# Patient Record
Sex: Female | Born: 1980 | Race: White | Hispanic: No | Marital: Married | Smoking: Never smoker
Health system: Southern US, Community
[De-identification: ages and names within clinical notes are randomized; demographics above are authoritative.]

## PROBLEM LIST (undated history)

## (undated) DIAGNOSIS — D696 Thrombocytopenia, unspecified: Principal | ICD-10-CM

## (undated) HISTORY — PX: TONSILLECTOMY AND ADENOIDECTOMY: SUR1326

## (undated) HISTORY — DX: Thrombocytopenia, unspecified: D69.6

---

## 2012-07-06 ENCOUNTER — Other Ambulatory Visit (HOSPITAL_COMMUNITY): Payer: Self-pay | Admitting: Obstetrics and Gynecology

## 2012-07-06 DIAGNOSIS — N979 Female infertility, unspecified: Secondary | ICD-10-CM

## 2012-07-12 ENCOUNTER — Ambulatory Visit (HOSPITAL_COMMUNITY)
Admission: RE | Admit: 2012-07-12 | Discharge: 2012-07-12 | Disposition: A | Discharge status: Home / Self Care | Payer: 59 | Source: Ambulatory Visit | Attending: Obstetrics and Gynecology | Admitting: Obstetrics and Gynecology

## 2012-07-12 DIAGNOSIS — N979 Female infertility, unspecified: Secondary | ICD-10-CM | POA: Insufficient documentation

## 2012-07-12 MED ORDER — IOHEXOL 300 MG/ML  SOLN: 5.0000 mL | Freq: Once | INTRAMUSCULAR | Status: AC | PRN

## 2012-09-22 NOTE — L&D Delivery Note (Signed)
Delivery Note At 5:19 AM a viable female was delivered via Vaginal, Spontaneous Delivery (Presentation: ; Occiput Anterior).  APGAR: 9 9    Placenta status: Intact, Spontaneous.  Cord: 3 vessels with the following complications: tight nuchal.  Cord pH: not obtained  Anesthesia: Epidural  Episiotomy: none Lacerations: first Suture Repair: 3.0 chromic Est. Blood Loss (mL): 300  Mom to postpartum.  Baby to nursery-stable.  Jasmyne Lodato L 06/29/2013, 5:30 AM

## 2012-12-21 LAB — OB RESULTS CONSOLE RUBELLA ANTIBODY, IGM
Rubella: IMMUNE
Rubella: IMMUNE

## 2012-12-21 LAB — OB RESULTS CONSOLE ABO/RH
RH Type: POSITIVE
RH Type: POSITIVE

## 2012-12-21 LAB — OB RESULTS CONSOLE GC/CHLAMYDIA
Chlamydia: NEGATIVE
Chlamydia: NEGATIVE

## 2012-12-21 LAB — OB RESULTS CONSOLE HEPATITIS B SURFACE ANTIGEN
Hepatitis B Surface Ag: NEGATIVE
Hepatitis B Surface Ag: NEGATIVE

## 2012-12-21 LAB — OB RESULTS CONSOLE HIV ANTIBODY (ROUTINE TESTING): HIV: NONREACTIVE

## 2012-12-21 LAB — OB RESULTS CONSOLE RPR: RPR: NONREACTIVE

## 2012-12-29 ENCOUNTER — Telehealth: Payer: Self-pay | Admitting: Oncology

## 2012-12-29 NOTE — Telephone Encounter (Signed)
LVOM FOR PT TO RETURN CALL IN RE NP APPT.  °

## 2012-12-30 ENCOUNTER — Telehealth: Payer: Self-pay | Admitting: Oncology

## 2012-12-30 NOTE — Telephone Encounter (Signed)
C/D 12/30/12 for appt. 01/12/13

## 2012-12-30 NOTE — Telephone Encounter (Signed)
S/W PT IN RE NP APPT 04/23 @ 3 W/DR. HA REFERRIGN DR. Renaldo Fiddler DX- [redacted]WKS PREGNANT- THROMBOCYTOPENIA  WELCOME PACKET MAILED.

## 2013-01-10 NOTE — Patient Instructions (Signed)
1.  Issue:  Thrombocytopenia (low platelet count). 2.  Potential causes:  Pregnancy, autoimune response.  3.  Recommendation:    *  Low risk of spontaneous bleeding wth plalet > 30.  *  If and when platelet <30, we should consider Prednisone.

## 2013-01-12 ENCOUNTER — Other Ambulatory Visit (HOSPITAL_BASED_OUTPATIENT_CLINIC_OR_DEPARTMENT_OTHER): Payer: 59 | Admitting: Lab

## 2013-01-12 ENCOUNTER — Ambulatory Visit (HOSPITAL_BASED_OUTPATIENT_CLINIC_OR_DEPARTMENT_OTHER): Payer: 59 | Admitting: Oncology

## 2013-01-12 ENCOUNTER — Ambulatory Visit: Payer: 59

## 2013-01-12 ENCOUNTER — Encounter: Payer: Self-pay | Admitting: Oncology

## 2013-01-12 ENCOUNTER — Telehealth: Payer: Self-pay | Admitting: Oncology

## 2013-01-12 VITALS — BP 127/81 | HR 88 | Temp 98.7°F | Resp 18 | Ht 66.0 in | Wt 117.6 lb

## 2013-01-12 DIAGNOSIS — D696 Thrombocytopenia, unspecified: Secondary | ICD-10-CM

## 2013-01-12 LAB — CBC WITH DIFFERENTIAL/PLATELET
BASO%: 0.2 % (ref 0.0–2.0)
Basophils Absolute: 0 10*3/uL (ref 0.0–0.1)
EOS%: 1.9 % (ref 0.0–7.0)
HCT: 37.9 % (ref 34.8–46.6)
HGB: 13.6 g/dL (ref 11.6–15.9)
LYMPH%: 21.8 % (ref 14.0–49.7)
MCH: 32 pg (ref 25.1–34.0)
MCHC: 35.9 g/dL (ref 31.5–36.0)
MCV: 89.2 fL (ref 79.5–101.0)
NEUT%: 68.7 % (ref 38.4–76.8)
Platelets: 103 10*3/uL — ABNORMAL LOW (ref 145–400)
lymph#: 2.2 10*3/uL (ref 0.9–3.3)

## 2013-01-12 LAB — COMPREHENSIVE METABOLIC PANEL (CC13)
ALT: 8 U/L (ref 0–55)
Albumin: 3.7 g/dL (ref 3.5–5.0)
Alkaline Phosphatase: 46 U/L (ref 40–150)
CO2: 24 mEq/L (ref 22–29)
Glucose: 86 mg/dl (ref 70–99)
Potassium: 3.6 mEq/L (ref 3.5–5.1)
Sodium: 137 mEq/L (ref 136–145)
Total Bilirubin: 0.25 mg/dL (ref 0.20–1.20)
Total Protein: 7.2 g/dL (ref 6.4–8.3)

## 2013-01-12 LAB — MORPHOLOGY: PLT EST: DECREASED

## 2013-01-12 LAB — VITAMIN B12: Vitamin B-12: 779 pg/mL (ref 211–911)

## 2013-01-12 NOTE — Progress Notes (Signed)
Checked in new patient. No financial issues. The patient was a little nervous. I walked her over to Lab.

## 2013-01-12 NOTE — Progress Notes (Signed)
[facility]  [phone] Fax:([phone]     INITIAL HEMATOLOGY CONSULTATION    Referral MD:  Dr. Renaldo Fiddler.   Reason for Referral: thrombocytopenia; [redacted] week pregnant GP0.     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  She is not aware of her baseline CBC.  She is now 10 week-pregnant with her first child.  She established obstetric care with Dr. Renaldo Fiddler.  A routine CBC was obtained on 12/21/2012 which showed WBC 9.6; hgb 14.5; Plt 92K.  Other work up showed HIV negative; HBsAg negative; normal free T4.  She was kindly referred to the [facility] for evaluation.   Lindsey Ramirez presented to the clinic by herself today.  She reported feeling well.  She is nervous being here.  Otherwise, she denies fever, anorexia, weight loss, fatigue, headache, visual changes, confusion, drenching night sweats, palpable lymph node swelling, mucositis, odynophagia, dysphagia, nausea vomiting, jaundice, chest pain, palpitation, shortness of breath, dyspnea on exertion, productive cough, gum bleeding, epistaxis, hematemesis, hemoptysis, abdominal pain, abdominal swelling, early satiety, melena, hematochezia, hematuria, skin rash, spontaneous bleeding, joint swelling, joint pain, heat or cold intolerance, bowel bladder incontinence, back pain, focal motor weakness, paresthesia, depression.      History reviewed. No pertinent past medical history.:    Past Surgical History  Procedure Laterality Date  . Tonsillectomy and adenoidectomy    :   CURRENT MEDS: Current Outpatient Prescriptions  Medication Sig Dispense Refill  . Prenatal Vit-Fe Fumarate-FA (MULTIVITAMIN-PRENATAL) 27-0.8 MG TABS Take 1 tablet by mouth daily at 12 noon.       No current facility-administered medications for this visit.      Allergies no known allergies:  Family History  Problem Relation Age of Onset  . Cancer Maternal Aunt     breast 60's.    :  History   Social History  . Marital Status: Unknown    Spouse Name: N/A    Number of Children: 0  . Years of Education: N/A   Occupational History  .      teacher, kindergarden.    Social History Main Topics  . Smoking status: Never Smoker   . Smokeless tobacco: Never Used  . Alcohol Use: No  . Drug Use: No  . Sexually Active: Not on file   Other Topics Concern  . Not on file   Social History Narrative  . No narrative on file  :  REVIEW OF SYSTEM:  The rest of the 14-point review of sytem was negative.   Exam: ECOG 0.   General:  well-nourished woman, in no acute distress.  Eyes:  no scleral icterus.  ENT:  There were no oropharyngeal lesions.  Neck was without thyromegaly.  Lymphatics:  Negative cervical, supraclavicular or axillary adenopathy.  Respiratory: lungs were clear bilaterally without wheezing or crackles.  Cardiovascular:  Regular rate and rhythm, S1/S2, without murmur, rub or gallop.  There was no pedal edema.  There was no splenomegaly.  Muscoloskeletal:  no spinal tenderness of palpation of vertebral spine.  Skin exam was without echymosis, petichae.  Neuro exam was nonfocal.  Patient was able to get on and off exam table without assistance.  Gait was normal.  Patient was alert and oriented.  Attention was good.   Language was appropriate.  Mood was normal without depression.  Speech was not pressured.  Thought content was not tangential.    LABS:  Lab Results  Component Value  Date   WBC 10.3 01/12/2013   HGB 13.6 01/12/2013   HCT 37.9 01/12/2013   PLT 103 Platelet count consistent in citrate* 01/12/2013   GLUCOSE 86 01/12/2013   ALT 8 01/12/2013   AST 16 01/12/2013   NA 137 01/12/2013   K 3.6 01/12/2013   CL 103 01/12/2013   CREATININE 0.8 01/12/2013   BUN 11.9 01/12/2013   CO2 24 01/12/2013    Blood smear review:   I personally reviewed the patient's peripheral blood smear today.  There was isocytosis.  There was no peripheral blast.  There was no  schistocytosis, spherocytosis, target cell, rouleaux formation, tear drop cell.  There was no giant platelets or platelet clumps.      ASSESSMENT AND PLAN:   1.  [redacted] week pregnant G1P0.  2.  Thrombocytopenia:  Unknown duration since no baseline CBC is available.  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  Extensive work up was negative for HIV, hepatitis, hypothyroid, VitB12 deficiency.  She has no symptoms of autoimmune disease. Her platelet has been stable from 3 weeks ago.  Gestational thrombocytopenia is normally seen during 3rd trimester.  Blood smear evaluation showed no sign of microscopic angiopathic hemolysis.  There was low clinical concern for a primary bone marrow failure process due to normal WBC and Hgb.  - Status:  Mild, stable thrombocytopenia.  Risk of spontaneous hemorrhage occurs when plt <30.  - Recommendation:  Watchful observation with monthly CBC here at the [facility].  There is no active bleeding.  There is no need for transfusion or intervention.  As her due date approaches in November 2014, her platelet count may need to be around 100 despite lack of severe thrombocytopenia as to minimize risk of bleeding with epidural anesthesia or C/S.   Lindsey Ramirez expressed informed understanding and agreed with the above recommendation.   The length of time of the face-to-face encounter was 45 minutes. More than 50% of time was spent counseling and coordination of care.     Thank you for this referral.

## 2013-01-12 NOTE — Telephone Encounter (Signed)
gve the pt her may-July 2014 appt calendars

## 2013-02-11 ENCOUNTER — Other Ambulatory Visit (HOSPITAL_BASED_OUTPATIENT_CLINIC_OR_DEPARTMENT_OTHER): Payer: 59 | Admitting: Lab

## 2013-02-11 DIAGNOSIS — D696 Thrombocytopenia, unspecified: Secondary | ICD-10-CM

## 2013-02-11 LAB — CBC WITH DIFFERENTIAL/PLATELET
BASO%: 0.3 % (ref 0.0–2.0)
EOS%: 1.6 % (ref 0.0–7.0)
MCH: 32.9 pg (ref 25.1–34.0)
MCHC: 35.6 g/dL (ref 31.5–36.0)
RDW: 12.4 % (ref 11.2–14.5)
lymph#: 2 10*3/uL (ref 0.9–3.3)

## 2013-02-15 ENCOUNTER — Encounter: Payer: Self-pay | Admitting: Oncology

## 2013-02-22 ENCOUNTER — Telehealth: Payer: Self-pay | Admitting: *Deleted

## 2013-02-22 NOTE — Telephone Encounter (Signed)
Pt reports a one inch spot behind her forearm which appears reddish w/ tiny red dots.  Denies any other areas of redness or tiny dots on her arms or legs or anywhere else on her body.  Denies any unusual bleeding. Instructed pt to call for any diffuse redness or bleeding.  This is likely a bruise since it is only in one small area.  If she develops more bruising or spots, call us back,  Otherwise keep appt for lab as scheduled on 6/23.  She verbalized understanding.

## 2013-03-14 ENCOUNTER — Other Ambulatory Visit (HOSPITAL_BASED_OUTPATIENT_CLINIC_OR_DEPARTMENT_OTHER): Payer: 59 | Admitting: Lab

## 2013-03-14 DIAGNOSIS — D696 Thrombocytopenia, unspecified: Secondary | ICD-10-CM

## 2013-03-14 LAB — CBC WITH DIFFERENTIAL/PLATELET
Basophils Absolute: 0 10*3/uL (ref 0.0–0.1)
Eosinophils Absolute: 0.1 10*3/uL (ref 0.0–0.5)
HCT: 33.4 % — ABNORMAL LOW (ref 34.8–46.6)
HGB: 11.7 g/dL (ref 11.6–15.9)
LYMPH%: 13.7 % — ABNORMAL LOW (ref 14.0–49.7)
MCV: 92.9 fL (ref 79.5–101.0)
MONO#: 0.8 10*3/uL (ref 0.1–0.9)
MONO%: 6.4 % (ref 0.0–14.0)
NEUT#: 9.4 10*3/uL — ABNORMAL HIGH (ref 1.5–6.5)
NEUT%: 78.3 % — ABNORMAL HIGH (ref 38.4–76.8)
Platelets: 77 10*3/uL — ABNORMAL LOW (ref 145–400)
WBC: 11.9 10*3/uL — ABNORMAL HIGH (ref 3.9–10.3)

## 2013-03-14 LAB — COMPREHENSIVE METABOLIC PANEL (CC13)
ALT: 7 U/L (ref 0–55)
BUN: 11.2 mg/dL (ref 7.0–26.0)
CO2: 22 mEq/L (ref 22–29)
Calcium: 8.8 mg/dL (ref 8.4–10.4)
Chloride: 106 mEq/L (ref 98–107)
Creatinine: 0.7 mg/dL (ref 0.6–1.1)
Glucose: 78 mg/dl (ref 70–99)
Total Bilirubin: 0.36 mg/dL (ref 0.20–1.20)

## 2013-03-14 LAB — CHCC SMEAR

## 2013-04-02 ENCOUNTER — Other Ambulatory Visit: Payer: Self-pay | Admitting: Oncology

## 2013-04-02 DIAGNOSIS — D696 Thrombocytopenia, unspecified: Secondary | ICD-10-CM

## 2013-04-04 ENCOUNTER — Other Ambulatory Visit (HOSPITAL_BASED_OUTPATIENT_CLINIC_OR_DEPARTMENT_OTHER): Payer: 59 | Admitting: Lab

## 2013-04-04 ENCOUNTER — Telehealth: Payer: Self-pay | Admitting: Oncology

## 2013-04-04 ENCOUNTER — Ambulatory Visit (HOSPITAL_BASED_OUTPATIENT_CLINIC_OR_DEPARTMENT_OTHER): Payer: 59 | Admitting: Oncology

## 2013-04-04 VITALS — BP 118/41 | HR 73 | Temp 98.4°F | Resp 18 | Ht 66.0 in | Wt 122.9 lb

## 2013-04-04 DIAGNOSIS — D696 Thrombocytopenia, unspecified: Secondary | ICD-10-CM

## 2013-04-04 DIAGNOSIS — D693 Immune thrombocytopenic purpura: Secondary | ICD-10-CM

## 2013-04-04 LAB — CBC WITH DIFFERENTIAL/PLATELET
BASO%: 0.3 % (ref 0.0–2.0)
Eosinophils Absolute: 0.2 10*3/uL (ref 0.0–0.5)
HCT: 34.3 % — ABNORMAL LOW (ref 34.8–46.6)
MCHC: 34.8 g/dL (ref 31.5–36.0)
MONO#: 0.7 10*3/uL (ref 0.1–0.9)
NEUT#: 7.3 10*3/uL — ABNORMAL HIGH (ref 1.5–6.5)
NEUT%: 75.4 % (ref 38.4–76.8)
Platelets: 78 10*3/uL — ABNORMAL LOW (ref 145–400)
WBC: 9.7 10*3/uL (ref 3.9–10.3)
lymph#: 1.5 10*3/uL (ref 0.9–3.3)

## 2013-04-04 NOTE — Telephone Encounter (Signed)
gv and printed appt sched and avs forl pt. °

## 2013-04-05 NOTE — Progress Notes (Signed)
[facility]  Telephone:([phone] Fax:([phone]) [phone]   OFFICE PROGRESS NOTE   Cc:  Pcp Not In System  DIAGNOSIS: gestational thrombocytopenia.   CURRENT THERAPY:  Observation.   INTERVAL HISTORY: Lindsey Ramirez 32 y.o. female returns for regular follow up.  She is now week 25 of pregnancy.  She is due to deliver 07/22/2013. She is still working full time.  She denied fatigue, fever, visible source of bleeding, SOB, chest pain, abd pain, skin rash.  The rest of the 14-point review of system was negative.     Past Surgical History  Procedure Laterality Date  . Tonsillectomy and adenoidectomy      Current Outpatient Prescriptions  Medication Sig Dispense Refill  . Prenatal Vit-Fe Fumarate-FA (MULTIVITAMIN-PRENATAL) 27-0.8 MG TABS Take 1 tablet by mouth daily at 12 noon.       No current facility-administered medications for this visit.    ALLERGIES:  has No Known Allergies.  REVIEW OF SYSTEMS:  The rest of the 14-point review of system was negative.   Filed Vitals:   04/04/13 1202  BP: 118/41  Pulse: 73  Temp: 98.4 F (36.9 C)  Resp: 18   Wt Readings from Last 3 Encounters:  04/04/13 122 lb 14.4 oz (55.747 kg)  01/12/13 117 lb 9.6 oz (53.343 kg)   ECOG Performance status: 0  PHYSICAL EXAMINATION:   General:  well-nourished woman, in no acute distress.  Eyes:  no scleral icterus.  ENT:  There were no oropharyngeal lesions.  Neck was without thyromegaly.  Lymphatics:  Negative cervical, supraclavicular or axillary adenopathy.  Respiratory: lungs were clear bilaterally without wheezing or crackles.  Cardiovascular:  Regular rate and rhythm, S1/S2, without murmur, rub or gallop.  There was no pedal edema. Muscoloskeletal:  no spinal tenderness of palpation of vertebral spine.  Skin exam was without echymosis, petichae.  Neuro exam was nonfocal.  Patient was able to get on and off exam table without assistance.  Gait was normal.  Patient was alert and  oriented.  Attention was good.   Language was appropriate.  Mood was normal without depression.  Speech was not pressured.  Thought content was not tangential.      LABORATORY/RADIOLOGY DATA:  Lab Results  Component Value Date   WBC 9.7 04/04/2013   HGB 12.0 04/04/2013   HCT 34.3* 04/04/2013   PLT 78* 04/04/2013   GLUCOSE 78 03/14/2013   ALKPHOS 45 03/14/2013   ALT 7 03/14/2013   AST 13 03/14/2013   NA 137 03/14/2013   K 3.8 03/14/2013   CL 106 03/14/2013   CREATININE 0.7 03/14/2013   BUN 11.2 03/14/2013   CO2 22 03/14/2013     ASSESSMENT AND PLAN:   1.  Diagnosis:  Gestational thrombocytopenia:   - Her Plt continues to slowly trend down.  It is still above 50K.  - The chance of spontaneously with platelet >50 is relatively low.  I recommend to continue watchful observation.  We will check her CBC every 2 weeks.  When it is <50, we will consider Prednisone.  Or when she is about 1 month out from delivery, and her platelet is still <100, we may also consider Prednisone with anticipation of possible epidural and C/section which will require platelet to be more than 100.  If there is no response to Prednisone, consideration includes IVIg or platelet transfusion.  - Ms. Patient expressed informed understanding and wished to continue as recommended.    I informed Ms. Freeze that I  am leaving the practice.  [facility] will arrange for him to see another provider when he returns.    The length of time of the face-to-face encounter was 15 minutes. More than 50% of time was spent counseling and coordination of care.

## 2013-04-18 ENCOUNTER — Other Ambulatory Visit (HOSPITAL_BASED_OUTPATIENT_CLINIC_OR_DEPARTMENT_OTHER): Payer: 59

## 2013-04-18 DIAGNOSIS — D693 Immune thrombocytopenic purpura: Secondary | ICD-10-CM

## 2013-04-18 LAB — CBC WITH DIFFERENTIAL/PLATELET
Basophils Absolute: 0 10*3/uL (ref 0.0–0.1)
HCT: 33.4 % — ABNORMAL LOW (ref 34.8–46.6)
HGB: 11.5 g/dL — ABNORMAL LOW (ref 11.6–15.9)
LYMPH%: 13.5 % — ABNORMAL LOW (ref 14.0–49.7)
MONO#: 0.7 10*3/uL (ref 0.1–0.9)
NEUT%: 77.4 % — ABNORMAL HIGH (ref 38.4–76.8)
Platelets: 68 10*3/uL — ABNORMAL LOW (ref 145–400)
WBC: 11.5 10*3/uL — ABNORMAL HIGH (ref 3.9–10.3)
lymph#: 1.5 10*3/uL (ref 0.9–3.3)

## 2013-04-20 ENCOUNTER — Telehealth: Payer: Self-pay | Admitting: *Deleted

## 2013-04-20 NOTE — Telephone Encounter (Signed)
Informed pt of Platelet count and relayed Kristin's message below.  She verbalized understanding.

## 2013-04-20 NOTE — Telephone Encounter (Signed)
Message copied by Wende Mott on Wed Apr 20, 2013  4:39 PM ------      Message from: Su Hilt C      Created: Mon Apr 18, 2013  4:51 PM                   ----- Message -----         From: Myrtis Ser, NP         Sent: 04/18/2013   2:26 PM           To: Marlowe Aschoff, RN            Please call pt. Plt are 68,000 today. Per Dr Lodema Pilot note, no indication for treatment unless <50,000. Continue observation. ------

## 2013-05-02 ENCOUNTER — Other Ambulatory Visit (HOSPITAL_BASED_OUTPATIENT_CLINIC_OR_DEPARTMENT_OTHER): Payer: 59

## 2013-05-02 DIAGNOSIS — D693 Immune thrombocytopenic purpura: Secondary | ICD-10-CM

## 2013-05-02 LAB — CBC WITH DIFFERENTIAL/PLATELET
Basophils Absolute: 0 10*3/uL (ref 0.0–0.1)
EOS%: 1.9 % (ref 0.0–7.0)
Eosinophils Absolute: 0.2 10*3/uL (ref 0.0–0.5)
HGB: 11.9 g/dL (ref 11.6–15.9)
NEUT#: 8 10*3/uL — ABNORMAL HIGH (ref 1.5–6.5)
RDW: 11.9 % (ref 11.2–14.5)
WBC: 10.2 10*3/uL (ref 3.9–10.3)
lymph#: 1.4 10*3/uL (ref 0.9–3.3)

## 2013-05-03 ENCOUNTER — Telehealth: Payer: Self-pay | Admitting: *Deleted

## 2013-05-03 NOTE — Telephone Encounter (Signed)
Message copied by Reesa Chew on Tue May 03, 2013  4:51 PM ------      Message from: Wende Mott      Created: Tue May 03, 2013  4:10 PM                   ----- Message -----         From: Myrtis Ser, NP         Sent: 05/03/2013   3:07 PM           To: Marlowe Aschoff, RN            Please call pt. Plt are 73,000 today. Per Dr Lodema Pilot last office note, no treatment indicated until plt <50,000. ------

## 2013-05-03 NOTE — Telephone Encounter (Signed)
Left message for patient to call desk nurse tomorrow.

## 2013-05-16 ENCOUNTER — Other Ambulatory Visit (HOSPITAL_BASED_OUTPATIENT_CLINIC_OR_DEPARTMENT_OTHER): Payer: 59

## 2013-05-16 ENCOUNTER — Other Ambulatory Visit: Payer: Self-pay | Admitting: *Deleted

## 2013-05-16 DIAGNOSIS — D696 Thrombocytopenia, unspecified: Secondary | ICD-10-CM

## 2013-05-16 LAB — CBC WITH DIFFERENTIAL/PLATELET
Basophils Absolute: 0 10*3/uL (ref 0.0–0.1)
Eosinophils Absolute: 0.1 10*3/uL (ref 0.0–0.5)
HGB: 11.7 g/dL (ref 11.6–15.9)
MCV: 90.9 fL (ref 79.5–101.0)
MONO#: 0.7 10*3/uL (ref 0.1–0.9)
MONO%: 5.9 % (ref 0.0–14.0)
NEUT#: 9.3 10*3/uL — ABNORMAL HIGH (ref 1.5–6.5)
Platelets: 82 10*3/uL — ABNORMAL LOW (ref 145–400)
RDW: 11.7 % (ref 11.2–14.5)
WBC: 11.8 10*3/uL — ABNORMAL HIGH (ref 3.9–10.3)

## 2013-05-30 ENCOUNTER — Other Ambulatory Visit (HOSPITAL_BASED_OUTPATIENT_CLINIC_OR_DEPARTMENT_OTHER): Payer: 59 | Admitting: Lab

## 2013-05-30 DIAGNOSIS — D693 Immune thrombocytopenic purpura: Secondary | ICD-10-CM

## 2013-05-30 LAB — CBC & DIFF AND RETIC
Basophils Absolute: 0 10*3/uL (ref 0.0–0.1)
Eosinophils Absolute: 0.2 10*3/uL (ref 0.0–0.5)
HCT: 34.9 % (ref 34.8–46.6)
LYMPH%: 17.5 % (ref 14.0–49.7)
MCV: 91.6 fL (ref 79.5–101.0)
MONO#: 0.8 10*3/uL (ref 0.1–0.9)
MONO%: 6.6 % (ref 0.0–14.0)
NEUT#: 8.6 10*3/uL — ABNORMAL HIGH (ref 1.5–6.5)
NEUT%: 74.4 % (ref 38.4–76.8)
Platelets: 78 10*3/uL — ABNORMAL LOW (ref 145–400)
RBC: 3.81 10*6/uL (ref 3.70–5.45)
Retic %: 2.02 % (ref 0.70–2.10)

## 2013-05-30 LAB — COMPREHENSIVE METABOLIC PANEL (CC13)
Albumin: 2.9 g/dL — ABNORMAL LOW (ref 3.5–5.0)
BUN: 11.3 mg/dL (ref 7.0–26.0)
CO2: 25 mEq/L (ref 22–29)
Calcium: 8.7 mg/dL (ref 8.4–10.4)
Chloride: 104 mEq/L (ref 98–109)
Glucose: 92 mg/dl (ref 70–140)
Potassium: 3.5 mEq/L (ref 3.5–5.1)
Sodium: 138 mEq/L (ref 136–145)
Total Protein: 6.1 g/dL — ABNORMAL LOW (ref 6.4–8.3)

## 2013-06-06 ENCOUNTER — Encounter: Payer: Self-pay | Admitting: Oncology

## 2013-06-10 ENCOUNTER — Telehealth: Payer: Self-pay | Admitting: *Deleted

## 2013-06-10 ENCOUNTER — Other Ambulatory Visit: Payer: Self-pay | Admitting: *Deleted

## 2013-06-10 DIAGNOSIS — D693 Immune thrombocytopenic purpura: Secondary | ICD-10-CM

## 2013-06-10 NOTE — Telephone Encounter (Signed)
Lm informing the pt that the doctor wil be out of the office on 9/22. gv appt for 06/21/13 w/ labs @ 2:15p and ov@ 2:45pm...td

## 2013-06-10 NOTE — Telephone Encounter (Signed)
sw pt gv appt for 06/13/13 w/ labs @ 2:45pm and ov @ 3:15pm...td

## 2013-06-13 ENCOUNTER — Other Ambulatory Visit (HOSPITAL_BASED_OUTPATIENT_CLINIC_OR_DEPARTMENT_OTHER): Payer: 59 | Admitting: Lab

## 2013-06-13 ENCOUNTER — Encounter: Payer: Self-pay | Admitting: Nurse Practitioner

## 2013-06-13 ENCOUNTER — Telehealth: Payer: Self-pay | Admitting: Hematology and Oncology

## 2013-06-13 ENCOUNTER — Ambulatory Visit (HOSPITAL_BASED_OUTPATIENT_CLINIC_OR_DEPARTMENT_OTHER): Payer: 59 | Admitting: Nurse Practitioner

## 2013-06-13 ENCOUNTER — Ambulatory Visit: Payer: 59

## 2013-06-13 ENCOUNTER — Other Ambulatory Visit: Payer: 59 | Admitting: Lab

## 2013-06-13 ENCOUNTER — Encounter: Payer: Self-pay | Admitting: Hematology and Oncology

## 2013-06-13 VITALS — BP 111/72 | HR 63 | Temp 99.0°F | Resp 19 | Ht 66.0 in | Wt 131.5 lb

## 2013-06-13 DIAGNOSIS — D693 Immune thrombocytopenic purpura: Secondary | ICD-10-CM

## 2013-06-13 DIAGNOSIS — D696 Thrombocytopenia, unspecified: Secondary | ICD-10-CM

## 2013-06-13 HISTORY — DX: Thrombocytopenia, unspecified: D69.6

## 2013-06-13 LAB — CBC WITH DIFFERENTIAL/PLATELET
Basophils Absolute: 0 10*3/uL (ref 0.0–0.1)
Eosinophils Absolute: 0.1 10*3/uL (ref 0.0–0.5)
HGB: 11.9 g/dL (ref 11.6–15.9)
MCV: 93.3 fL (ref 79.5–101.0)
MONO%: 6.5 % (ref 0.0–14.0)
NEUT#: 8.9 10*3/uL — ABNORMAL HIGH (ref 1.5–6.5)
RBC: 3.78 10*6/uL (ref 3.70–5.45)
RDW: 11.9 % (ref 11.2–14.5)
WBC: 11.5 10*3/uL — ABNORMAL HIGH (ref 3.9–10.3)
lymph#: 1.8 10*3/uL (ref 0.9–3.3)

## 2013-06-13 MED ORDER — PREDNISONE 20 MG PO TABS: 60.0000 mg | ORAL_TABLET | Freq: Every day | ORAL | Status: DC

## 2013-06-13 NOTE — Telephone Encounter (Signed)
Gave pt appt for labs and MD for october and September 2014

## 2013-06-13 NOTE — Progress Notes (Signed)
I have reviewed the patient's chart extensively and collaborated the history with the patient and my NP today. I agreed with the assessment and plan as outlined in her notes.  The most likely cause of her low platelet count is ITP. I would like to start her on a trial of prednisone at 1mg /kg and see her in my clinic on a weekly basis until delivery. We discussed risks,benefits and side-effects of prednsone and she is in agreement to proceed. Taking prednisone should not be harmful to the baby at 3rd trimester. If she does not respond to prednisone, she may also benefit from IVIG Rx.

## 2013-06-13 NOTE — Progress Notes (Signed)
OFFICE PROGRESS NOTE  Interval history:  Ms. Trego is a 32 year old woman currently 34+ weeks into a first pregnancy. She was initially referred to Dr. Gaylyn Rong at approximately 10 weeks into the pregnancy for thrombocytopenia.  At the time of her first visit with Dr. Gaylyn Rong on 01/12/2013 the platelet count returned at 103,000, hemoglobin 13.6 and white count 10.3. Per that office note extensive workup was negative for HIV, hepatitis, hypothyroid and vitamin B 12 deficiency. She had no symptoms of autoimmune disease. Blood smear evaluation showed no signs of microscopic angiopathic hemolysis. Dr. Gaylyn Rong felt she most likely had ITP and recommended watchful observation with monthly CBCs.  Subsequent platelet counts as follows: 03/14/2013 77,000; 04/04/2013 78,000; 04/18/2013 68,000; 05/02/2013 73,000; 05/16/2013 82,000; 05/30/2013 78,000.  She presents today for routine followup and to establish care with Dr. Bertis Ruddy.  She denies any prior medical problems. She underwent a tonsillectomy and adenoidectomy in childhood. She had her wisdom teeth extracted in high school. She is not aware of any bleeding complications with either surgery.  She denies any family history of a hematological or bleeding disorder.  Current medications include prenatal multivitamin.  She has no medication allergies.  She overall feels well. She notes that she is tired toward the end of the day. She denies any bleeding or unusual bruising. No shortness of breath. No leg swelling or calf pain. No cough. She denies shortness of breath. No GI or GU complaints.  Objective: Blood pressure 111/72, pulse 63, temperature 99 F (37.2 C), temperature source Oral, resp. rate 19, height 5\' 6"  (1.676 m), weight 131 lb 8 oz (59.648 kg).  Oropharynx is without thrush or ulceration. No palpable cervical or supraclavicular lymph nodes. Lungs are clear. Regular cardiac rhythm. Abdomen consistent with pregnancy. No leg edema. Calves nontender.  Lab  Results: Lab Results  Component Value Date   WBC 11.5* 06/13/2013   HGB 11.9 06/13/2013   HCT 35.3 06/13/2013   MCV 93.3 06/13/2013   PLT 76* 06/13/2013    Chemistry:    Chemistry      Component Value Date/Time   NA 138 05/30/2013 1553   K 3.5 05/30/2013 1553   CL 106 03/14/2013 1608   CO2 25 05/30/2013 1553   BUN 11.3 05/30/2013 1553   CREATININE 0.8 05/30/2013 1553      Component Value Date/Time   CALCIUM 8.7 05/30/2013 1553   ALKPHOS 91 05/30/2013 1553   AST 22 05/30/2013 1553   ALT 12 05/30/2013 1553   BILITOT 0.44 05/30/2013 1553       Studies/Results: No results found.  Medications: I have reviewed the patient's current medications.  Assessment/Plan:  1. Thrombocytopenia, likely ITP. 2. Currently 34+ weeks first pregnancy.  Disposition-Lindsey Ramirez has stable thrombocytopenia. Dr. Bertis Ruddy reviewed the diagnosis of ITP and the rationale for initiation of steroids with Ms. Arth (need for platelet count to be greater than 100,000 for an epidural or if she would require a C-section).  Dr. Bertis Ruddy recommends prednisone (1 mg per kilogram) 60 mg daily to begin 06/14/2013 and weekly CBCs. We reviewed potential side effects associated with short-term and long-term use of steroids. Ms. Crounse is agreeable to the above.  She will return for a followup visit with Dr. Bertis Ruddy on 06/20/2013. She will contact the office in the interim with any problems, questions or concerns.  Patient seen with Dr. Bertis Ruddy.  Lonna Cobb ANP/GNP-BC

## 2013-06-14 ENCOUNTER — Telehealth: Payer: Self-pay | Admitting: Hematology and Oncology

## 2013-06-14 NOTE — Telephone Encounter (Signed)
Pt called and r.s  lab and MD to October 2014

## 2013-06-20 ENCOUNTER — Ambulatory Visit: Payer: 59 | Admitting: Hematology and Oncology

## 2013-06-20 ENCOUNTER — Other Ambulatory Visit: Payer: 59 | Admitting: Lab

## 2013-06-21 ENCOUNTER — Other Ambulatory Visit: Payer: 59 | Admitting: Lab

## 2013-06-21 ENCOUNTER — Ambulatory Visit: Payer: 59 | Admitting: Hematology and Oncology

## 2013-06-24 ENCOUNTER — Other Ambulatory Visit (HOSPITAL_BASED_OUTPATIENT_CLINIC_OR_DEPARTMENT_OTHER): Payer: 59 | Admitting: Lab

## 2013-06-24 DIAGNOSIS — D696 Thrombocytopenia, unspecified: Secondary | ICD-10-CM

## 2013-06-24 DIAGNOSIS — D693 Immune thrombocytopenic purpura: Secondary | ICD-10-CM

## 2013-06-24 LAB — CBC WITH DIFFERENTIAL/PLATELET
BASO%: 0.1 % (ref 0.0–2.0)
EOS%: 0 % (ref 0.0–7.0)
HCT: 37.2 % (ref 34.8–46.6)
LYMPH%: 6.3 % — ABNORMAL LOW (ref 14.0–49.7)
MCH: 31.8 pg (ref 25.1–34.0)
MCHC: 34.1 g/dL (ref 31.5–36.0)
MONO%: 1.8 % (ref 0.0–14.0)
NEUT%: 91.8 % — ABNORMAL HIGH (ref 38.4–76.8)
Platelets: 106 10*3/uL — ABNORMAL LOW (ref 145–400)

## 2013-06-27 ENCOUNTER — Other Ambulatory Visit: Payer: 59 | Admitting: Lab

## 2013-06-27 ENCOUNTER — Telehealth: Payer: Self-pay | Admitting: Hematology and Oncology

## 2013-06-27 ENCOUNTER — Encounter: Payer: Self-pay | Admitting: Hematology and Oncology

## 2013-06-27 ENCOUNTER — Ambulatory Visit (HOSPITAL_BASED_OUTPATIENT_CLINIC_OR_DEPARTMENT_OTHER): Payer: 59 | Admitting: Hematology and Oncology

## 2013-06-27 VITALS — BP 108/69 | HR 69 | Temp 98.5°F | Resp 18 | Ht 66.0 in | Wt 130.9 lb

## 2013-06-27 DIAGNOSIS — D696 Thrombocytopenia, unspecified: Secondary | ICD-10-CM

## 2013-06-27 DIAGNOSIS — O99891 Other specified diseases and conditions complicating pregnancy: Secondary | ICD-10-CM

## 2013-06-27 DIAGNOSIS — D693 Immune thrombocytopenic purpura: Secondary | ICD-10-CM

## 2013-06-27 NOTE — Progress Notes (Signed)
[facility] OFFICE PROGRESS NOTE  Pcp Not In System DIAGNOSIS: Chronic thrombocytopenia, likely ITP  SUMMARY OF HEMATOLOGIC HISTORY: This is a patient was found to have thrombocytopenia during pregnancy. Her baseline bloodwork from April 2014 was 103,000. Extensive work up including HIV panel, hepatitis, thyroid function tell test and vitamin B12 level are within normal limits. She was placed on observation until her platelet count got progressively lower and to 68,000 in April. On 06/11/2013, we decided to start her on prednisone. Her repeat CBC from last week showed her platelet count has now gone up to over 100,000. INTERVAL HISTORY: Lindsey Ramirez 32 y.o. female returns for further followup regarding thrombocytopenia related to pregnancy. She's doing well and the baby is developing well. She denies any spontaneous bleeding such as epistaxis, hematuria, or hematochezia. Her only complaint is some mild visual changes all on prednisone.  I have reviewed the past medical history, past surgical history, social history and family history with the patient and they are unchanged from previous note.  ALLERGIES:  has No Known Allergies.  MEDICATIONS: Current outpatient prescriptions:predniSONE (DELTASONE) 20 MG tablet, Take 3 tablets (60 mg total) by mouth daily., Disp: 100 tablet, Rfl: 1;  Prenatal Vit-Fe Fumarate-FA (MULTIVITAMIN-PRENATAL) 27-0.8 MG TABS, Take 1 tablet by mouth daily at 12 noon., Disp: , Rfl:   REVIEW OF SYSTEMS:   Constitutional: Denies fevers, chills or abnormal weight loss Behavioral/Psych: Mood is stable, no new changes  All other systems were reviewed with the patient and are negative.  PHYSICAL EXAMINATION: ECOG PERFORMANCE STATUS: 0 - Asymptomatic  Filed Vitals:   06/27/13 1540  BP: 108/69  Pulse: 69  Temp: 98.5 F (36.9 C)  Resp: 18   Filed Weights   06/27/13 1540  Weight: 130 lb 14.4 oz (59.376 kg)    GENERAL:alert, no distress and  comfortable SKIN: skin color, texture, turgor are normal, no rashes or significant lesions NEURO: alert & oriented x 3 with fluent speech, no focal motor/sensory deficits  LABORATORY DATA:  I have reviewed the data as listed. CBC from last week show it has improved to 106,000.  ASSESSMENT: Thrombocytopenia with pregnancy, likely ITP   PLAN:  #1 thrombocytopenia #2 pregnancy She is responding well to prednisone. Her platelet count is now over 100,000. She is feeling well with minimal side effects with prednisone. Apart from mild visual change or she's doing well. She continue followup with the OB on a weekly basis. We'll continue platelet count monitoring on a weekly basis. She is due to she's doing well, will continue at the current dose of prednisone without dose adjustment. The patient is aware even after recovery of her platelet count after pregnancy, she need to go on a taper course. I do not recommend invasive procedure that was done on the elective basis even with platelet count of over 100,000. There is no contraindication, however, for her to have an elective C-section or epidural if needed. All questions were answered. The patient knows to call the clinic with any problems, questions or concerns. We can certainly see the patient much sooner if necessary. No barriers to learning was detected.  I spent 15 minutes counseling the patient face to face. The total time spent in the appointment was 20 minutes and more than 50% was on counseling.     [facility], Natoshia Souter, MD 06/27/2013 4:07 PM

## 2013-06-27 NOTE — Telephone Encounter (Signed)
GV ADN PRINTED APPT SCHED AND AVS FOR PT FOR oct °

## 2013-06-28 ENCOUNTER — Encounter (HOSPITAL_COMMUNITY): Payer: Self-pay

## 2013-06-28 ENCOUNTER — Inpatient Hospital Stay (HOSPITAL_COMMUNITY)
Admission: AD | Admit: 2013-06-28 | Discharge: 2013-07-01 | DRG: 775 | Disposition: A | Discharge status: Home / Self Care | Payer: 59 | Source: Ambulatory Visit | Attending: Obstetrics and Gynecology | Admitting: Obstetrics and Gynecology

## 2013-06-28 DIAGNOSIS — D696 Thrombocytopenia, unspecified: Secondary | ICD-10-CM

## 2013-06-28 DIAGNOSIS — D689 Coagulation defect, unspecified: Secondary | ICD-10-CM | POA: Diagnosis present

## 2013-06-28 NOTE — MAU Note (Signed)
Pt states around 1030 she felt a large gush of fluid. States it was clear. Is having UC every 5 minutes. Denies vaginal bleeding. States good FM.

## 2013-06-29 ENCOUNTER — Inpatient Hospital Stay (HOSPITAL_COMMUNITY): Payer: 59 | Admitting: Anesthesiology

## 2013-06-29 ENCOUNTER — Encounter (HOSPITAL_COMMUNITY): Payer: 59 | Admitting: Anesthesiology

## 2013-06-29 ENCOUNTER — Encounter (HOSPITAL_COMMUNITY): Payer: Self-pay | Admitting: *Deleted

## 2013-06-29 LAB — CBC
HCT: 34 % — ABNORMAL LOW (ref 36.0–46.0)
Hemoglobin: 12.7 g/dL (ref 12.0–15.0)
Hemoglobin: 11.8 g/dL — ABNORMAL LOW (ref 12.0–15.0)
MCH: 31.1 pg (ref 26.0–34.0)
MCH: 31.7 pg (ref 26.0–34.0)
MCHC: 34.1 g/dL (ref 30.0–36.0)
MCHC: 34.4 g/dL (ref 30.0–36.0)
MCHC: 34.3 g/dL (ref 30.0–36.0)
Platelets: 75 10*3/uL — ABNORMAL LOW (ref 150–400)
Platelets: 81 10*3/uL — ABNORMAL LOW (ref 150–400)
RBC: 4.08 MIL/uL (ref 3.87–5.11)
RBC: 3.72 MIL/uL — ABNORMAL LOW (ref 3.87–5.11)
RDW: 12.1 % (ref 11.5–15.5)
RDW: 12.1 % (ref 11.5–15.5)
RDW: 12.3 % (ref 11.5–15.5)
WBC: 23 10*3/uL — ABNORMAL HIGH (ref 4.0–10.5)

## 2013-06-29 LAB — RPR: RPR Ser Ql: NONREACTIVE

## 2013-06-29 MED ORDER — LIDOCAINE HCL (PF) 1 % IJ SOLN
30.0000 mL | INTRAMUSCULAR | Status: AC | PRN
  Administered 2013-06-29 (×2): 5 mL via SUBCUTANEOUS

## 2013-06-29 MED ORDER — WITCH HAZEL-GLYCERIN EX PADS: 1.0000 "application " | MEDICATED_PAD | CUTANEOUS | Status: DC | PRN

## 2013-06-29 MED ORDER — ACETAMINOPHEN 325 MG PO TABS: 650.0000 mg | ORAL_TABLET | ORAL | Status: DC | PRN

## 2013-06-29 MED ORDER — OXYTOCIN BOLUS FROM INFUSION: 500.0000 mL | INTRAVENOUS | Status: DC

## 2013-06-29 MED ORDER — PHENYLEPHRINE 40 MCG/ML (10ML) SYRINGE FOR IV PUSH (FOR BLOOD PRESSURE SUPPORT)
80.0000 ug | PREFILLED_SYRINGE | INTRAVENOUS | Status: DC | PRN
  Filled 2013-06-29: qty 2

## 2013-06-29 MED ORDER — PREDNISONE 50 MG PO TABS
60.0000 mg | ORAL_TABLET | Freq: Every day | ORAL | Status: DC
  Filled 2013-06-29 (×3): qty 1

## 2013-06-29 MED ORDER — MEDROXYPROGESTERONE ACETATE 150 MG/ML IM SUSP: 150.0000 mg | INTRAMUSCULAR | Status: DC | PRN

## 2013-06-29 MED ORDER — BENZOCAINE-MENTHOL 20-0.5 % EX AERO: 1.0000 "application " | INHALATION_SPRAY | CUTANEOUS | Status: DC | PRN

## 2013-06-29 MED ORDER — IBUPROFEN 600 MG PO TABS: 600.0000 mg | ORAL_TABLET | Freq: Four times a day (QID) | ORAL | Status: DC

## 2013-06-29 MED ORDER — BISACODYL 10 MG RE SUPP: 10.0000 mg | Freq: Every day | RECTAL | Status: DC | PRN

## 2013-06-29 MED ORDER — FLEET ENEMA 7-19 GM/118ML RE ENEM: 1.0000 | ENEMA | RECTAL | Status: DC | PRN

## 2013-06-29 MED ORDER — IBUPROFEN 600 MG PO TABS: 600.0000 mg | ORAL_TABLET | Freq: Four times a day (QID) | ORAL | Status: DC | PRN

## 2013-06-29 MED ORDER — LACTATED RINGERS IV SOLN: 500.0000 mL | INTRAVENOUS | Status: DC | PRN

## 2013-06-29 MED ORDER — OXYTOCIN 40 UNITS IN LACTATED RINGERS INFUSION - SIMPLE MED
62.5000 mL/h | INTRAVENOUS | Status: DC
  Administered 2013-06-29: 62.5 mL/h via INTRAVENOUS
  Filled 2013-06-29: qty 1000

## 2013-06-29 MED ORDER — DIBUCAINE 1 % RE OINT: 1.0000 "application " | TOPICAL_OINTMENT | RECTAL | Status: DC | PRN

## 2013-06-29 MED ORDER — LACTATED RINGERS IV SOLN
INTRAVENOUS | Status: DC
  Administered 2013-06-29: 03:00:00 via INTRAVENOUS

## 2013-06-29 MED ORDER — LIDOCAINE HCL (PF) 1 % IJ SOLN
INTRAMUSCULAR | Status: AC
  Administered 2013-06-29: 30 mL
  Filled 2013-06-29: qty 30

## 2013-06-29 MED ORDER — SENNOSIDES-DOCUSATE SODIUM 8.6-50 MG PO TABS
2.0000 | ORAL_TABLET | ORAL | Status: DC
  Filled 2013-06-29: qty 2

## 2013-06-29 MED ORDER — DIPHENHYDRAMINE HCL 50 MG/ML IJ SOLN: 12.5000 mg | INTRAMUSCULAR | Status: DC | PRN

## 2013-06-29 MED ORDER — OXYCODONE-ACETAMINOPHEN 5-325 MG PO TABS: 1.0000 | ORAL_TABLET | ORAL | Status: DC | PRN

## 2013-06-29 MED ORDER — FENTANYL 2.5 MCG/ML BUPIVACAINE 1/10 % EPIDURAL INFUSION (WH - ANES)
INTRAMUSCULAR | Status: DC | PRN
  Administered 2013-06-29: 1 mL/h via EPIDURAL
  Administered 2013-06-29: 14 mL/h via EPIDURAL

## 2013-06-29 MED ORDER — FLEET ENEMA 7-19 GM/118ML RE ENEM: 1.0000 | ENEMA | Freq: Every day | RECTAL | Status: DC | PRN

## 2013-06-29 MED ORDER — LACTATED RINGERS IV SOLN: 500.0000 mL | Freq: Once | INTRAVENOUS | Status: DC

## 2013-06-29 MED ORDER — PRENATAL MULTIVITAMIN CH
1.0000 | ORAL_TABLET | Freq: Every day | ORAL | Status: DC
  Administered 2013-06-29 – 2013-07-01 (×3): 1 via ORAL
  Filled 2013-06-29 (×2): qty 1

## 2013-06-29 MED ORDER — ZOLPIDEM TARTRATE 5 MG PO TABS: 5.0000 mg | ORAL_TABLET | Freq: Every evening | ORAL | Status: DC | PRN

## 2013-06-29 MED ORDER — FENTANYL 2.5 MCG/ML BUPIVACAINE 1/10 % EPIDURAL INFUSION (WH - ANES): 14.0000 mL/h | INTRAMUSCULAR | Status: DC | PRN

## 2013-06-29 MED ORDER — ONDANSETRON HCL 4 MG/2ML IJ SOLN: 4.0000 mg | INTRAMUSCULAR | Status: DC | PRN

## 2013-06-29 MED ORDER — EPHEDRINE 5 MG/ML INJ
10.0000 mg | INTRAVENOUS | Status: DC | PRN
  Filled 2013-06-29: qty 2

## 2013-06-29 MED ORDER — LANOLIN HYDROUS EX OINT: TOPICAL_OINTMENT | CUTANEOUS | Status: DC | PRN

## 2013-06-29 MED ORDER — ONDANSETRON HCL 4 MG/2ML IJ SOLN: 4.0000 mg | Freq: Four times a day (QID) | INTRAMUSCULAR | Status: DC | PRN

## 2013-06-29 MED ORDER — ACETAMINOPHEN 325 MG PO TABS: 325.0000 mg | ORAL_TABLET | ORAL | Status: DC | PRN

## 2013-06-29 MED ORDER — SIMETHICONE 80 MG PO CHEW: 80.0000 mg | CHEWABLE_TABLET | ORAL | Status: DC | PRN

## 2013-06-29 MED ORDER — DIPHENHYDRAMINE HCL 25 MG PO CAPS: 25.0000 mg | ORAL_CAPSULE | Freq: Four times a day (QID) | ORAL | Status: DC | PRN

## 2013-06-29 MED ORDER — TETANUS-DIPHTH-ACELL PERTUSSIS 5-2.5-18.5 LF-MCG/0.5 IM SUSP: 0.5000 mL | Freq: Once | INTRAMUSCULAR | Status: DC

## 2013-06-29 MED ORDER — CITRIC ACID-SODIUM CITRATE 334-500 MG/5ML PO SOLN: 30.0000 mL | ORAL | Status: DC | PRN

## 2013-06-29 MED ORDER — ONDANSETRON HCL 4 MG PO TABS: 4.0000 mg | ORAL_TABLET | ORAL | Status: DC | PRN

## 2013-06-29 MED ORDER — MEASLES, MUMPS & RUBELLA VAC ~~LOC~~ INJ
0.5000 mL | INJECTION | Freq: Once | SUBCUTANEOUS | Status: DC
  Filled 2013-06-29: qty 0.5

## 2013-06-29 NOTE — H&P (Signed)
Lindsey Ramirez is a 32 y.o. female presenting for SROM Maternal Medical History:  Reason for admission: Rupture of membranes and contractions.   Contractions: Frequency: regular.    Fetal activity: Perceived fetal activity is normal.    Prenatal complications: no prenatal complications   OB History   Grav Para Term Preterm Abortions TAB SAB Ect Mult Living   1              Past Medical History  Diagnosis Date  . Thrombocytopenia, unspecified 06/13/2013   Past Surgical History  Procedure Laterality Date  . Tonsillectomy and adenoidectomy     Family History: family history includes Cancer in her maternal aunt. Social History:  reports that she has never smoked. She has never used smokeless tobacco. She reports that she does not drink alcohol or use illicit drugs.   Prenatal Transfer Tool  Maternal Diabetes: No Genetic Screening: Normal Maternal Ultrasounds/Referrals: Normal Fetal Ultrasounds or other Referrals:  None Maternal Substance Abuse:  No Significant Maternal Medications:  None Significant Maternal Lab Results:  None Other Comments:  None  Review of Systems  All other systems reviewed and are negative.    Dilation: 9 Effacement (%): 100 Station: +1 Exam by:: LCarpenter,RN Blood pressure 141/86, pulse 75, temperature 98.6 F (37 C), temperature source Oral, resp. rate 22, last menstrual period 07/06/2012, SpO2 98.00%. Maternal Exam:  Uterine Assessment: Contraction strength is moderate.  Abdomen: Fetal presentation: vertex     Physical Exam  Nursing note and vitals reviewed.   Prenatal labs: ABO, Rh: O, O/Positive, Positive/-- (04/01 0000) Antibody: Negative (04/01 0000) Rubella: Immune, Immune (04/01 0000) RPR: NON REACTIVE (10/08 0020)  HBsAg: Negative, Negative (04/01 0000)  HIV: Non-reactive, Non-reactive (04/01 0000)  GBS: Negative (09/30 0000)   Assessment/Plan: SROM Anticipate NSVD   Lindsey Ramirez L 06/29/2013, 5:27 AM

## 2013-06-29 NOTE — Progress Notes (Signed)
Post Partum Day 0 Subjective: no complaints, up ad lib and tolerating PO  Objective: Blood pressure 128/78, pulse 68, temperature 98.6 F (37 C), temperature source Oral, resp. rate 18, last menstrual period 07/06/2012, SpO2 98.00%, unknown if currently breastfeeding.  Physical Exam:  General: alert and cooperative Lochia: appropriate Uterine Fundus: firm Incision: perineum intact DVT Evaluation: No evidence of DVT seen on physical exam. Negative Homan's sign. No cords or calf tenderness. No significant calf/ankle edema.   Recent Labs  06/29/13 0020 06/29/13 0615  HGB 12.7 11.7*  HCT 37.2 34.0*    Assessment/Plan: Plan for discharge tomorrow  Motrin on hold CBC to be repeated at 6 pm   LOS: 1 day   CURTIS,CAROL G 06/29/2013, 8:33 AM

## 2013-06-29 NOTE — Progress Notes (Signed)
Dr. Brayton Caves is at the pts bedside discussing epidural with pt and her husband. The pt's Platelet count is low and has been getting lower. It is suggested that she go ahead and get it now before the chance of maybe getting a lower repeat plt count.

## 2013-06-29 NOTE — Lactation Note (Signed)
This note was copied from the chart of Lindsey Ramirez. Lactation Consultation Note  Patient Name: Lindsey Ramirez [id number] Date: 06/29/2013 Reason for consult: Initial assessment;Infant < 6lbs;Late preterm infant Assisted Mom with latching baby in side lying position. Once latched baby demonstrated a good rhythmic suck with few swallows audible.  Baby has been sleepy and it has been several hours since she was at the breast. Encouraged Mom to BF with feeding ques, at least every 3 hours. STS when Mom is awake.  Discussed late preterm behaviors and need to wake baby to BF. BF basics reviewed. Lactation brochure left for review. Advised of OP services and support group. Advised to call as needed for assist.   Maternal Data Formula Feeding for Exclusion: No Infant to breast within first hour of birth: Yes Has patient been taught Hand Expression?: Yes Does the patient have breastfeeding experience prior to this delivery?: No  Feeding Feeding Type: Breast Fed  LATCH Score/Interventions Latch: Repeated attempts needed to sustain latch, nipple held in mouth throughout feeding, stimulation needed to elicit sucking reflex. Intervention(s): Adjust position;Assist with latch;Breast massage;Breast compression  Audible Swallowing: A few with stimulation  Type of Nipple: Everted at rest and after stimulation  Comfort (Breast/Nipple): Soft / non-tender     Hold (Positioning): Assistance needed to correctly position infant at breast and maintain latch.  LATCH Score: 7  Lactation Tools Discussed/Used WIC Program: No   Consult Status Consult Status: Follow-up Date: 06/30/13 Follow-up type: In-patient    Alfred Levins 06/29/2013, 2:30 PM

## 2013-06-29 NOTE — Anesthesia Postprocedure Evaluation (Signed)
  Anesthesia Post-op Note  Anesthesia Post Note  Patient: Lindsey Ramirez  Procedure(s) Performed: * No procedures listed *  Anesthesia type: Epidural  Patient location: Mother/Baby  Post pain: Pain level controlled  Post assessment: Post-op Vital signs reviewed  Last Vitals:  Filed Vitals:   06/29/13 1200  BP: 111/67  Pulse: 69  Temp: 37.1 C  Resp: 18    Post vital signs: Reviewed  Level of consciousness:alert  Complications: No apparent anesthesia complications

## 2013-06-29 NOTE — Anesthesia Procedure Notes (Signed)
Epidural Patient location during procedure: OB Start time: 06/29/2013 1:40 AM  Staffing Anesthesiologist: Angus Seller., Harrell Gave. Performed by: anesthesiologist   Preanesthetic Checklist Completed: patient identified, site marked, surgical consent, pre-op evaluation, timeout performed, IV checked, risks and benefits discussed and monitors and equipment checked  Epidural Patient position: sitting Prep: site prepped and draped and DuraPrep Patient monitoring: continuous pulse ox and blood pressure Approach: midline Injection technique: LOR air  Needle:  Needle type: Tuohy  Needle gauge: 17 G Needle length: 9 cm and 9 Needle insertion depth: 4 cm Catheter type: closed end flexible Catheter size: 19 Gauge Catheter at skin depth: 9 cm Test dose: negative  Assessment Events: blood not aspirated, injection not painful, no injection resistance, negative IV test and no paresthesia  Additional Notes Patient identified.  Risk benefits discussed including failed block, incomplete pain control, headache, nerve damage, paralysis, blood pressure changes, nausea, vomiting, reactions to medication both toxic or allergic, and postpartum back pain.  Patient expressed understanding and wished to proceed.  All questions were answered.  Sterile technique used throughout procedure and epidural site dressed with sterile barrier dressing. No paresthesia or other complications noted.The patient did not experience any signs of intravascular injection such as tinnitus or metallic taste in mouth nor signs of intrathecal spread such as rapid motor block. Please see nursing notes for vital signs.

## 2013-06-29 NOTE — Anesthesia Preprocedure Evaluation (Signed)
Anesthesia Evaluation  Patient identified by MRN, date of birth, ID band Patient awake    Reviewed: Allergy & Precautions, H&P , Patient's Chart, lab work & pertinent test results  Airway Mallampati: II TM Distance: >3 FB Neck ROM: full    Dental no notable dental hx.    Pulmonary neg pulmonary ROS,  breath sounds clear to auscultation  Pulmonary exam normal       Cardiovascular negative cardio ROS  Rhythm:regular Rate:Normal     Neuro/Psych negative neurological ROS  negative psych ROS   GI/Hepatic negative GI ROS, Neg liver ROS,   Endo/Other  negative endocrine ROS  Renal/GU negative Renal ROS     Musculoskeletal   Abdominal   Peds  Hematology negative hematology ROS (+)   Anesthesia Other Findings Low platelets  Reproductive/Obstetrics (+) Pregnancy                           Anesthesia Physical Anesthesia Plan  ASA: II  Anesthesia Plan: Epidural   Post-op Pain Management:    Induction:   Airway Management Planned:   Additional Equipment:   Intra-op Plan:   Post-operative Plan:   Informed Consent: I have reviewed the patients History and Physical, chart, labs and discussed the procedure including the risks, benefits and alternatives for the proposed anesthesia with the patient or authorized representative who has indicated his/her understanding and acceptance.     Plan Discussed with:   Anesthesia Plan Comments:         Anesthesia Quick Evaluation

## 2013-06-30 LAB — CBC
HCT: 33.3 % — ABNORMAL LOW (ref 36.0–46.0)
Hemoglobin: 11.5 g/dL — ABNORMAL LOW (ref 12.0–15.0)
MCV: 92 fL (ref 78.0–100.0)
Platelets: 81 10*3/uL — ABNORMAL LOW (ref 150–400)
RBC: 3.62 MIL/uL — ABNORMAL LOW (ref 3.87–5.11)
RDW: 12.4 % (ref 11.5–15.5)
WBC: 20.4 10*3/uL — ABNORMAL HIGH (ref 4.0–10.5)

## 2013-06-30 NOTE — Progress Notes (Signed)
Post Partum Day 1 Subjective: no complaints, up ad lib, voiding, tolerating PO, + flatus and baby jittery, planning discharge in am   Objective: Blood pressure 115/62, pulse 68, temperature 98.6 F (37 C), temperature source Oral, resp. rate 18, last menstrual period 07/06/2012, SpO2 98.00%, unknown if currently breastfeeding.  Physical Exam:  General: alert and cooperative Lochia: appropriate Uterine Fundus: firm, reports small clots passed yesterday Incision: perineum  intact DVT Evaluation: No evidence of DVT seen on physical exam. Negative Homan's sign. No cords or calf tenderness. No significant calf/ankle edema.   Recent Labs  06/29/13 1806 06/30/13 0615  HGB 11.8* 11.5*  HCT 34.4* 33.3*    Assessment/Plan: Plan for discharge tomorrow   LOS: 2 days   CURTIS,CAROL G 06/30/2013, 8:43 AM

## 2013-06-30 NOTE — Lactation Note (Signed)
This note was copied from the chart of Lindsey Mery Guadalupe. Lactation Consultation Note  Patient Name: Lindsey Ramirez [id number] Date: 06/30/2013 Reason for consult: Follow-up assessment;Infant < 6lbs;Late preterm infant Baby just had her bath and was sleepy but would latch. Assisted Mom in cross cradle hold. Demonstrated how to obtain good depth with latch and reviewed importance of deep latch to milk transfer. Baby is voiding/stools WNL, has been to the breast many times, weight loss normal. Set up pumping schedule for Mom to post pump to encourage milk production. Encouraged to post pump every 3 hours for 15 minutes on preemie setting. Reviewed cleaning of pump parts and storage of milk. Encouraged to give the baby back any amount of EBM she obtains with pumping. FOB is checking on breast pump for home use. Advised to ask for assist as needed.   Maternal Data    Feeding Feeding Type: Breast Fed Length of feed: 15 min  LATCH Score/Interventions Latch: Grasps breast easily, tongue down, lips flanged, rhythmical sucking. Intervention(s): Adjust position;Assist with latch;Breast massage;Breast compression  Audible Swallowing: None Intervention(s): Skin to skin  Type of Nipple: Everted at rest and after stimulation  Comfort (Breast/Nipple): Soft / non-tender     Hold (Positioning): Assistance needed to correctly position infant at breast and maintain latch. Intervention(s): Breastfeeding basics reviewed;Support Pillows;Position options;Skin to skin  LATCH Score: 7  Lactation Tools Discussed/Used Tools: Pump Breast pump type: Double-Electric Breast Pump Pump Review: Setup, frequency, and cleaning;Milk Storage Initiated by:: KG Date initiated:: 06/30/13   Consult Status Consult Status: Follow-up Date: 07/01/13 Follow-up type: In-patient    Alfred Levins 06/30/2013, 5:11 PM

## 2013-07-01 ENCOUNTER — Other Ambulatory Visit: Payer: 59 | Admitting: Lab

## 2013-07-01 ENCOUNTER — Telehealth: Payer: Self-pay | Admitting: *Deleted

## 2013-07-01 LAB — CBC
Hemoglobin: 12.1 g/dL (ref 12.0–15.0)
MCH: 32.2 pg (ref 26.0–34.0)
MCV: 92.8 fL (ref 78.0–100.0)
Platelets: 104 10*3/uL — ABNORMAL LOW (ref 150–400)
RBC: 3.76 MIL/uL — ABNORMAL LOW (ref 3.87–5.11)
WBC: 20.5 10*3/uL — ABNORMAL HIGH (ref 4.0–10.5)

## 2013-07-01 NOTE — Discharge Summary (Signed)
Obstetric Discharge Summary Reason for Admission: rupture of membranes Prenatal Procedures: ultrasound Intrapartum Procedures: spontaneous vaginal delivery Postpartum Procedures: none Complications-Operative and Postpartum: 1 degree perineal laceration HGB  Date Value Range Status  06/24/2013 12.7  11.6 - 15.9 g/dL Final     Hemoglobin  Date Value Range Status  07/01/2013 12.1  12.0 - 15.0 g/dL Final     HCT  Date Value Range Status  07/01/2013 34.9* 36.0 - 46.0 % Final  06/24/2013 37.2  34.8 - 46.6 % Final    Physical Exam:  General: alert and cooperative Lochia: appropriate Uterine Fundus: firm Incision: perineum intact DVT Evaluation: No evidence of DVT seen on physical exam. Negative Homan's sign. No cords or calf tenderness. No significant calf/ankle edema.  Discharge Diagnoses: Term Pregnancy-delivered  Discharge Information: Date: 07/01/2013 Activity: pelvic rest Diet: routine Medications: PNV Condition: stable Instructions: refer to practice specific booklet Discharge to: home  patient to contact hematologist regarding prednisone taper for ITP     Newborn Data: Live born female  Birth Weight: 5 lb 5.5 oz (2424 g) APGAR: 9, 9  Home with mother.  Gene Glazebrook G 07/01/2013, 9:10 AM

## 2013-07-01 NOTE — Telephone Encounter (Signed)
Pt had her baby girl early 10/08 (due date was Oct 31st).  Pt and baby are healthy.  Pt had labs done today at GYN and Platelet count is 104.  She continues to take Prednisone 60 mg daily as prescribed.  Has appt to see Dr. Bertis Ruddy next week on Thursday 10/16.  Asks if any new instructions or continue same dose prednisone until office visit?

## 2013-07-01 NOTE — Telephone Encounter (Signed)
Continue same dose until she sees me back

## 2013-07-04 ENCOUNTER — Other Ambulatory Visit: Payer: 59

## 2013-07-07 ENCOUNTER — Encounter: Payer: Self-pay | Admitting: Hematology and Oncology

## 2013-07-07 ENCOUNTER — Ambulatory Visit (HOSPITAL_BASED_OUTPATIENT_CLINIC_OR_DEPARTMENT_OTHER): Payer: 59 | Admitting: Hematology and Oncology

## 2013-07-07 ENCOUNTER — Telehealth: Payer: Self-pay | Admitting: Hematology and Oncology

## 2013-07-07 ENCOUNTER — Other Ambulatory Visit (HOSPITAL_BASED_OUTPATIENT_CLINIC_OR_DEPARTMENT_OTHER): Payer: 59 | Admitting: Lab

## 2013-07-07 VITALS — BP 117/76 | HR 76 | Temp 97.6°F | Resp 19 | Ht 66.0 in | Wt 111.6 lb

## 2013-07-07 DIAGNOSIS — D696 Thrombocytopenia, unspecified: Secondary | ICD-10-CM

## 2013-07-07 DIAGNOSIS — D693 Immune thrombocytopenic purpura: Secondary | ICD-10-CM

## 2013-07-07 LAB — CBC WITH DIFFERENTIAL/PLATELET
BASO%: 0.1 % (ref 0.0–2.0)
Basophils Absolute: 0 10*3/uL (ref 0.0–0.1)
Eosinophils Absolute: 0 10*3/uL (ref 0.0–0.5)
HCT: 41.5 % (ref 34.8–46.6)
MCH: 31.2 pg (ref 25.1–34.0)
MCHC: 32.9 g/dL (ref 31.5–36.0)
MONO#: 0.2 10*3/uL (ref 0.1–0.9)
NEUT#: 14.7 10*3/uL — ABNORMAL HIGH (ref 1.5–6.5)
NEUT%: 94.1 % — ABNORMAL HIGH (ref 38.4–76.8)
Platelets: 131 10*3/uL — ABNORMAL LOW (ref 145–400)
WBC: 15.6 10*3/uL — ABNORMAL HIGH (ref 3.9–10.3)
lymph#: 0.7 10*3/uL — ABNORMAL LOW (ref 0.9–3.3)

## 2013-07-07 NOTE — Telephone Encounter (Signed)
gv pt appt schedule for October.  °

## 2013-07-07 NOTE — Progress Notes (Signed)
[facility] OFFICE PROGRESS NOTE  Pcp Not In System DIAGNOSIS:  Thrombocytopenia, likely ITP  SUMMARY OF HEMATOLOGIC HISTORY: This is a patient was found to have thrombocytopenia during pregnancy. Her baseline bloodwork from April 2014 was 103,000. Extensive work up including HIV panel, hepatitis, thyroid function tell test and vitamin B12 level are within normal limits. She was placed on observation until her platelet count got progressively lower and to 68,000 in April. On 06/11/2013, we decided to start her on prednisone. Her repeat CBC from last week showed her platelet count has now gone up to over 100,000. INTERVAL HISTORY: Lindsey Ramirez 32 y.o. female returns for further followup. Last week, she had a spontaneous vaginal delivery. Her platelet count was over 100,000 and she was able to receive epidural to help with labor pains. She denies any excessive bleeding. She tolerated prednisone well. She is currently on prednisone 40 mg daily. She denies any recent bleeding such as epistaxis, hematuria, or hematochezia.  I have reviewed the past medical history, past surgical history, social history and family history with the patient and they are unchanged from previous note.  ALLERGIES:  has No Known Allergies.  MEDICATIONS:  Current Outpatient Prescriptions  Medication Sig Dispense Refill  . predniSONE (DELTASONE) 20 MG tablet Take 40 mg by mouth daily.      Marland Kitchen PRENATAL VIT-FE FUMARATE-FA PO Take by mouth daily.       No current facility-administered medications for this visit.     REVIEW OF SYSTEMS:   Constitutional: Denies fevers, chills or night sweats Behavioral/Psych: Mood is stable, no new changes  All other systems were reviewed with the patient and are negative.  PHYSICAL EXAMINATION: ECOG PERFORMANCE STATUS: 0 - Asymptomatic  Filed Vitals:   07/07/13 1533  BP: 117/76  Pulse: 76  Temp: 97.6 F (36.4 C)  Resp: 19   Filed Weights   07/07/13 1533   Weight: 111 lb 9.6 oz (50.621 kg)    GENERAL:alert, no distress and comfortable SKIN: skin color, texture, turgor are normal, no rashes or significant lesions EYES: normal, Conjunctiva are pink and non-injected, sclera clear OROPHARYNX:no exudate, no erythema and lips, buccal mucosa, and tongue normal  NECK: supple, thyroid normal size, non-tender, without nodularity LYMPH:  no palpable lymphadenopathy in the cervical, axillary or inguinal LUNGS: clear to auscultation and percussion with normal breathing effort HEART: regular rate & rhythm and no murmurs and no lower extremity edema ABDOMEN:abdomen soft, non-tender and normal bowel sounds Musculoskeletal:no cyanosis of digits and no clubbing  NEURO: alert & oriented x 3 with fluent speech, no focal motor/sensory deficits  LABORATORY DATA:  I have reviewed the data as listed Results for orders placed in visit on 07/07/13 (from the past 48 hour(s))  CBC WITH DIFFERENTIAL     Status: Abnormal   Collection Time    07/07/13  3:15 PM      Result Value Range   WBC 15.6 (*) 3.9 - 10.3 10e3/uL   NEUT# 14.7 (*) 1.5 - 6.5 10e3/uL   HGB 13.6  11.6 - 15.9 g/dL   HCT 16.1  09.6 - 04.5 %   Platelets 131 (*) 145 - 400 10e3/uL   MCV 95.0  79.5 - 101.0 fL   MCH 31.2  25.1 - 34.0 pg   MCHC 32.9  31.5 - 36.0 g/dL   RBC 4.09  8.11 - 9.14 10e6/uL   RDW 12.6  11.2 - 14.5 %   lymph# 0.7 (*) 0.9 - 3.3 10e3/uL  MONO# 0.2  0.1 - 0.9 10e3/uL   Eosinophils Absolute 0.0  0.0 - 0.5 10e3/uL   Basophils Absolute 0.0  0.0 - 0.1 10e3/uL   NEUT% 94.1 (*) 38.4 - 76.8 %   LYMPH% 4.6 (*) 14.0 - 49.7 %   MONO% 1.2  0.0 - 14.0 %   EOS% 0.0  0.0 - 7.0 %   BASO% 0.1  0.0 - 2.0 %   ASSESSMENT:  Chronic thrombocytopenia, likely ITP  PLAN:  #1 chronic thrombocytopenia Platelet count has improved since delivery of her baby. I recommend the patient to continue on prednisone 40 mg daily for now. My goal will be to get it above 140 before we stop initiate any further  taper. She's tolerating prednisone well without significant side effects. She is asymptomatic from the low platelet count #2 anemia, resolved Her most recent anemia could be just related to pregnancy. It has resolved. I'd recommend the patient continue on prenatal vitamin and she is nursing her baby. All questions were answered. The patient knows to call the clinic with any problems, questions or concerns. No barriers to learning was detected.  I spent 15 minutes counseling the patient face to face. The total time spent in the appointment was 20 minutes and more than 50% was on counseling.     [facility], Catrell Morrone, MD 07/07/2013 4:32 PM

## 2013-07-11 ENCOUNTER — Other Ambulatory Visit: Payer: 59

## 2013-07-21 ENCOUNTER — Other Ambulatory Visit (HOSPITAL_BASED_OUTPATIENT_CLINIC_OR_DEPARTMENT_OTHER): Payer: 59 | Admitting: Lab

## 2013-07-21 ENCOUNTER — Ambulatory Visit (HOSPITAL_BASED_OUTPATIENT_CLINIC_OR_DEPARTMENT_OTHER): Payer: 59 | Admitting: Hematology and Oncology

## 2013-07-21 ENCOUNTER — Telehealth: Payer: Self-pay | Admitting: *Deleted

## 2013-07-21 VITALS — BP 114/69 | HR 81 | Temp 97.5°F | Resp 18 | Ht 66.0 in | Wt 110.0 lb

## 2013-07-21 DIAGNOSIS — D696 Thrombocytopenia, unspecified: Secondary | ICD-10-CM

## 2013-07-21 LAB — CBC WITH DIFFERENTIAL/PLATELET
Basophils Absolute: 0.1 10*3/uL (ref 0.0–0.1)
HCT: 42.5 % (ref 34.8–46.6)
HGB: 14.1 g/dL (ref 11.6–15.9)
LYMPH%: 23.4 % (ref 14.0–49.7)
MONO#: 1.1 10*3/uL — ABNORMAL HIGH (ref 0.1–0.9)
NEUT%: 67.4 % (ref 38.4–76.8)
Platelets: 72 10*3/uL — ABNORMAL LOW (ref 145–400)
WBC: 13.1 10*3/uL — ABNORMAL HIGH (ref 3.9–10.3)
lymph#: 3.1 10*3/uL (ref 0.9–3.3)

## 2013-07-21 MED ORDER — PREDNISONE 20 MG PO TABS: ORAL_TABLET | ORAL | Status: DC

## 2013-07-21 NOTE — Telephone Encounter (Signed)
appts made and printed...td 

## 2013-07-21 NOTE — Progress Notes (Signed)
[facility] OFFICE PROGRESS NOTE  Pcp Not In System DIAGNOSIS:  Chronic thrombocytopenia  SUMMARY OF HEMATOLOGIC HISTORY: This is a patient was found to have thrombocytopenia during pregnancy. Her baseline bloodwork from April 2014 was 103,000. Extensive work up including HIV panel, hepatitis, thyroid function tell test and vitamin B12 level are within normal limits. She was placed on observation until her platelet count got progressively lower and to 68,000 in April. On 06/11/2013, we decided to start her on prednisone. Her repeat CBC from last week showed her platelet count has now gone up to over 100,000. INTERVAL HISTORY: Lindsey Ramirez 32 y.o. female returns for return visit. She's doing well. The patient denies any recent signs or symptoms of bleeding such as spontaneous epistaxis, hematuria or hematochezia. The patient continue on prednisone 40 mg a day. She did not miss any doses. He denies any recent infection. I have reviewed the past medical history, past surgical history, social history and family history with the patient and they are unchanged from previous note.  ALLERGIES:  has No Known Allergies.  MEDICATIONS:  Current Outpatient Prescriptions  Medication Sig Dispense Refill  . PRENATAL VIT-FE FUMARATE-FA PO Take by mouth daily.      . predniSONE (DELTASONE) 20 MG tablet Take 4 tabs daily  100 tablet  1   No current facility-administered medications for this visit.     REVIEW OF SYSTEMS:   Constitutional: Denies fevers, chills or night sweats Behavioral/Psych: Mood is stable, no new changes  All other systems were reviewed with the patient and are negative.  PHYSICAL EXAMINATION: ECOG PERFORMANCE STATUS: 0 - Asymptomatic  Filed Vitals:   07/21/13 1350  BP: 114/69  Pulse: 81  Temp: 97.5 F (36.4 C)  Resp: 18   Filed Weights   07/21/13 1350  Weight: 110 lb (49.896 kg)    GENERAL:alert, no distress and comfortable SKIN: skin color, texture,  turgor are normal, no rashes or significant lesions Musculoskeletal:no cyanosis of digits and no clubbing  NEURO: alert & oriented x 3 with fluent speech, no focal motor/sensory deficits  LABORATORY DATA:  I have reviewed the data as listed Results for orders placed in visit on 07/21/13 (from the past 48 hour(s))  CBC WITH DIFFERENTIAL     Status: Abnormal   Collection Time    07/21/13  1:26 PM      Result Value Range   WBC 13.1 (*) 3.9 - 10.3 10e3/uL   NEUT# 8.8 (*) 1.5 - 6.5 10e3/uL   HGB 14.1  11.6 - 15.9 g/dL   HCT 16.1  09.6 - 04.5 %   Platelets 72 (*) 145 - 400 10e3/uL   MCV 94.5  79.5 - 101.0 fL   MCH 31.4  25.1 - 34.0 pg   MCHC 33.2  31.5 - 36.0 g/dL   RBC 4.09  8.11 - 9.14 10e6/uL   RDW 12.6  11.2 - 14.5 %   lymph# 3.1  0.9 - 3.3 10e3/uL   MONO# 1.1 (*) 0.1 - 0.9 10e3/uL   Eosinophils Absolute 0.0  0.0 - 0.5 10e3/uL   Basophils Absolute 0.1  0.0 - 0.1 10e3/uL   NEUT% 67.4  38.4 - 76.8 %   LYMPH% 23.4  14.0 - 49.7 %   MONO% 8.6  0.0 - 14.0 %   EOS% 0.2  0.0 - 7.0 %   BASO% 0.4  0.0 - 2.0 %    ASSESSMENT:  Steroid refractory thrombocytopenia, likely ITP  PLAN:  #1 progressive thrombocytopenia Have  a long discussion with the patient. I gave her explanation why her platelet count could drop. I discussed with her the pathophysiology of ITP. Due to her recent pregnancy, we were not able to order imaging study. I recommend we increase the prednisone up to 80 mg and she come back once a week for blood work. Next week, I will order additional testing to rule out autoimmune diseases and infections as potential causes. If the test results are unrevealing, we might have to bite the bullet and order imaging study to rule out lymphoma. The patient is quite distressed to know this. She is in agreement to increase the prednisone to 80 mg a day. I will see her back in 2 weeks to review test results. All questions were answered. The patient knows to call the clinic with any  problems, questions or concerns. No barriers to learning was detected. [facility], Kaleyah Labreck, MD 07/21/2013 3:08 PM

## 2013-07-25 ENCOUNTER — Other Ambulatory Visit: Payer: 59

## 2013-07-28 ENCOUNTER — Other Ambulatory Visit: Payer: Self-pay

## 2013-07-28 ENCOUNTER — Other Ambulatory Visit (HOSPITAL_BASED_OUTPATIENT_CLINIC_OR_DEPARTMENT_OTHER): Payer: 59

## 2013-07-28 ENCOUNTER — Telehealth: Payer: Self-pay | Admitting: Hematology and Oncology

## 2013-07-28 DIAGNOSIS — D696 Thrombocytopenia, unspecified: Secondary | ICD-10-CM

## 2013-07-28 LAB — CBC WITH DIFFERENTIAL/PLATELET
Basophils Absolute: 0 10*3/uL (ref 0.0–0.1)
EOS%: 0 % (ref 0.0–7.0)
Eosinophils Absolute: 0 10*3/uL (ref 0.0–0.5)
HCT: 46.3 % (ref 34.8–46.6)
HGB: 14.9 g/dL (ref 11.6–15.9)
LYMPH%: 7.1 % — ABNORMAL LOW (ref 14.0–49.7)
MCV: 95.2 fL (ref 79.5–101.0)
MONO%: 2.7 % (ref 0.0–14.0)
NEUT#: 11.8 10*3/uL — ABNORMAL HIGH (ref 1.5–6.5)
RDW: 12.9 % (ref 11.2–14.5)
lymph#: 0.9 10*3/uL (ref 0.9–3.3)

## 2013-07-28 LAB — COMPREHENSIVE METABOLIC PANEL (CC13)
AST: 11 U/L (ref 5–34)
Alkaline Phosphatase: 62 U/L (ref 40–150)
Anion Gap: 13 mEq/L — ABNORMAL HIGH (ref 3–11)
BUN: 17.3 mg/dL (ref 7.0–26.0)
Calcium: 10 mg/dL (ref 8.4–10.4)
Chloride: 103 mEq/L (ref 98–109)
Creatinine: 1 mg/dL (ref 0.6–1.1)
Glucose: 106 mg/dl (ref 70–140)
Total Bilirubin: 0.68 mg/dL (ref 0.20–1.20)

## 2013-07-28 LAB — MORPHOLOGY: PLT EST: DECREASED

## 2013-07-28 NOTE — Telephone Encounter (Signed)
NG pt scheduled w/MM in error. Moved 11/12 f/u to NG @ 2:15pm and lb @ 1:45pm. lmonvm informing pt w/new time for 11/12 lb/NG @ 1:45pm.

## 2013-07-29 LAB — HEPATITIS B CORE ANTIBODY, IGM: Hep B C IgM: NONREACTIVE

## 2013-07-29 LAB — HEPATITIS B SURFACE ANTIGEN: Hepatitis B Surface Ag: NEGATIVE

## 2013-07-29 LAB — HEPATITIS B SURFACE ANTIBODY,QUALITATIVE: Hep B S Ab: NEGATIVE

## 2013-08-03 ENCOUNTER — Other Ambulatory Visit (HOSPITAL_BASED_OUTPATIENT_CLINIC_OR_DEPARTMENT_OTHER): Payer: 59 | Admitting: Lab

## 2013-08-03 ENCOUNTER — Ambulatory Visit (HOSPITAL_BASED_OUTPATIENT_CLINIC_OR_DEPARTMENT_OTHER): Payer: 59 | Admitting: Hematology and Oncology

## 2013-08-03 VITALS — BP 148/87 | HR 100 | Temp 99.4°F | Resp 18 | Ht 66.0 in | Wt 107.1 lb

## 2013-08-03 DIAGNOSIS — D693 Immune thrombocytopenic purpura: Secondary | ICD-10-CM

## 2013-08-03 DIAGNOSIS — D696 Thrombocytopenia, unspecified: Secondary | ICD-10-CM

## 2013-08-03 LAB — CBC WITH DIFFERENTIAL/PLATELET
Basophils Absolute: 0 10*3/uL (ref 0.0–0.1)
EOS%: 0 % (ref 0.0–7.0)
HCT: 45.5 % (ref 34.8–46.6)
HGB: 14.9 g/dL (ref 11.6–15.9)
LYMPH%: 6 % — ABNORMAL LOW (ref 14.0–49.7)
MCH: 31.2 pg (ref 25.1–34.0)
MCHC: 32.8 g/dL (ref 31.5–36.0)
MCV: 95.1 fL (ref 79.5–101.0)
MONO%: 1.7 % (ref 0.0–14.0)
NEUT%: 92.2 % — ABNORMAL HIGH (ref 38.4–76.8)
Platelets: 111 10*3/uL — ABNORMAL LOW (ref 145–400)
WBC: 15.5 10*3/uL — ABNORMAL HIGH (ref 3.9–10.3)

## 2013-08-03 NOTE — Progress Notes (Signed)
[facility] OFFICE PROGRESS NOTE  Pcp Not In System DIAGNOSIS:  Steroid refractory ITP  SUMMARY OF HEMATOLOGIC HISTORY: This is a patient was found to have thrombocytopenia during pregnancy. Her baseline bloodwork from April 2014 was 103,000. Extensive work up including HIV panel, hepatitis, thyroid function tell test and vitamin B12 level are within normal limits. She was placed on observation until her platelet count got progressively lower and to 68,000 in April. On 06/11/2013, we decided to start her on prednisone. Her repeat CBC from last week showed her platelet count has now gone up to over 100,000. INTERVAL HISTORY: Lindsey Ramirez 32 y.o. female returns for further followup. From our last visit, we increase the prednisone from 40 mg to 80 mg. Her platelet count has improved however she felt that she is not able to nurse her daughter well with higher dose of prednisone The patient denies any recent signs or symptoms of bleeding such as spontaneous epistaxis, hematuria or hematochezia.  I have reviewed the past medical history, past surgical history, social history and family history with the patient and they are unchanged from previous note.  ALLERGIES:  has No Known Allergies.  MEDICATIONS:  Current Outpatient Prescriptions  Medication Sig Dispense Refill  . predniSONE (DELTASONE) 20 MG tablet Take 4 tabs daily  100 tablet  1  . PRENATAL VIT-FE FUMARATE-FA PO Take by mouth daily.       No current facility-administered medications for this visit.     REVIEW OF SYSTEMS:   Constitutional: Denies fevers, chills or night sweats Behavioral/Psych: Mood is depressed All other systems were reviewed with the patient and are negative.  PHYSICAL EXAMINATION: ECOG PERFORMANCE STATUS: 0 - Asymptomatic  Filed Vitals:   08/03/13 1411  BP: 148/87  Pulse: 100  Temp: 99.4 F (37.4 C)  Resp: 18   Filed Weights   08/03/13 1411  Weight: 107 lb 1.6 oz (48.58 kg)     GENERAL:alert, no distress and comfortable NEURO: alert & oriented x 3 with fluent speech, no focal motor/sensory deficits  LABORATORY DATA:  I have reviewed the data as listed Results for orders placed in visit on 07/28/13 (from the past 48 hour(s))  CBC WITH DIFFERENTIAL     Status: Abnormal   Collection Time    08/03/13  1:52 PM      Result Value Range   WBC 15.5 (*) 3.9 - 10.3 10e3/uL   NEUT# 14.3 (*) 1.5 - 6.5 10e3/uL   HGB 14.9  11.6 - 15.9 g/dL   HCT 16.1  09.6 - 04.5 %   Platelets 111 (*) 145 - 400 10e3/uL   MCV 95.1  79.5 - 101.0 fL   MCH 31.2  25.1 - 34.0 pg   MCHC 32.8  31.5 - 36.0 g/dL   RBC 4.09  8.11 - 9.14 10e6/uL   RDW 12.9  11.2 - 14.5 %   lymph# 0.9  0.9 - 3.3 10e3/uL   MONO# 0.3  0.1 - 0.9 10e3/uL   Eosinophils Absolute 0.0  0.0 - 0.5 10e3/uL   Basophils Absolute 0.0  0.0 - 0.1 10e3/uL   NEUT% 92.2 (*) 38.4 - 76.8 %   LYMPH% 6.0 (*) 14.0 - 49.7 %   MONO% 1.7  0.0 - 14.0 %   EOS% 0.0  0.0 - 7.0 %   BASO% 0.1  0.0 - 2.0 %    ASSESSMENT:  Steroid refractory ITP  PLAN:  #1 steroid refractory ITP Have a long discussion with the patient and  her husband. The patient would rather die than not able to nurse her daughter. She wants to come off the prednisone and is willing to take the risk that her platelet count might run low and she may have bleeding consequences. A recommend she start steroid taper by reducing her prednisone down to 40 mg for 5 days then 20 mg for 5 days and then stop. I will see her back in 12 days with repeat blood work. In the meantime she is educated to watch for signs and symptoms of bleeding. All questions were answered. The patient knows to call the clinic with any problems, questions or concerns. No barriers to learning was detected.  I spent 15 minutes counseling the patient face to face. The total time spent in the appointment was 20 minutes and more than 50% was on counseling.     [facility], Peytin Dechert, MD 08/03/2013 2:38 PM

## 2013-08-04 ENCOUNTER — Telehealth: Payer: Self-pay | Admitting: Hematology and Oncology

## 2013-08-04 NOTE — Telephone Encounter (Signed)
, °

## 2013-08-16 ENCOUNTER — Telehealth: Payer: Self-pay | Admitting: Hematology and Oncology

## 2013-08-16 ENCOUNTER — Other Ambulatory Visit (HOSPITAL_BASED_OUTPATIENT_CLINIC_OR_DEPARTMENT_OTHER): Payer: 59 | Admitting: Lab

## 2013-08-16 ENCOUNTER — Ambulatory Visit (HOSPITAL_BASED_OUTPATIENT_CLINIC_OR_DEPARTMENT_OTHER): Payer: 59 | Admitting: Hematology and Oncology

## 2013-08-16 VITALS — BP 120/82 | HR 72 | Temp 98.6°F | Resp 19 | Ht 66.0 in | Wt 109.6 lb

## 2013-08-16 DIAGNOSIS — D693 Immune thrombocytopenic purpura: Secondary | ICD-10-CM

## 2013-08-16 DIAGNOSIS — D696 Thrombocytopenia, unspecified: Secondary | ICD-10-CM

## 2013-08-16 LAB — CBC WITH DIFFERENTIAL/PLATELET
Basophils Absolute: 0 10*3/uL (ref 0.0–0.1)
Eosinophils Absolute: 0 10*3/uL (ref 0.0–0.5)
HCT: 42.3 % (ref 34.8–46.6)
HGB: 13.7 g/dL (ref 11.6–15.9)
MONO#: 0.9 10*3/uL (ref 0.1–0.9)
NEUT#: 9.5 10*3/uL — ABNORMAL HIGH (ref 1.5–6.5)
NEUT%: 75.8 % (ref 38.4–76.8)
WBC: 12.5 10*3/uL — ABNORMAL HIGH (ref 3.9–10.3)
lymph#: 2 10*3/uL (ref 0.9–3.3)

## 2013-08-16 NOTE — Progress Notes (Signed)
[facility] OFFICE PROGRESS NOTE  Pcp Not In System DIAGNOSIS:  Steroid refractory ITP  SUMMARY OF HEMATOLOGIC HISTORY: This is a patient was found to have thrombocytopenia during pregnancy. Her baseline bloodwork from April 2014 was 103,000. Extensive work up including HIV panel, hepatitis, thyroid function tell test and vitamin B12 level are within normal limits. She was placed on observation until her platelet count got progressively lower and to 68,000 in April. On 06/11/2013, we decided to start her on prednisone. Her repeat CBC from last week showed her platelet count has now gone up to over 100,000. October 2014, we increased her dose to 80 mg because when we started to taper her steroids, her platelet count starts to drop. 08/03/2013 we start tapering steroid dose precipitously because the patient is suffering from side effects with crying spells,  feeling jittery and having difficulties nursing her baby. The patient has made an informed consent to continue on steroid taper. INTERVAL HISTORY: Antonya Leeder 32 y.o. female returns for further followup. Her last dose of prednisone was yesterday. The patient denies any recent signs or symptoms of bleeding such as spontaneous epistaxis, hematuria or hematochezia. Denies feeling jittery.  I have reviewed the past medical history, past surgical history, social history and family history with the patient and they are unchanged from previous note.  ALLERGIES:  has No Known Allergies.  MEDICATIONS:  Current Outpatient Prescriptions  Medication Sig Dispense Refill  . PRENATAL VIT-FE FUMARATE-FA PO Take by mouth daily.       No current facility-administered medications for this visit.     REVIEW OF SYSTEMS:   Constitutional: Denies fevers, chills or night sweats All other systems were reviewed with the patient and are negative.  PHYSICAL EXAMINATION: ECOG PERFORMANCE STATUS: 0 - Asymptomatic  Filed Vitals:   08/16/13  1251  BP: 120/82  Pulse: 72  Temp: 98.6 F (37 C)  Resp: 19   Filed Weights   08/16/13 1251  Weight: 109 lb 9.6 oz (49.714 kg)    GENERAL:alert, no distress and comfortable NEURO: alert & oriented x 3 with fluent speech, no focal motor/sensory deficits  LABORATORY DATA:  I have reviewed the data as listed Results for orders placed in visit on 08/16/13 (from the past 48 hour(s))  CBC WITH DIFFERENTIAL     Status: Abnormal   Collection Time    08/16/13 12:27 PM      Result Value Range   WBC 12.5 (*) 3.9 - 10.3 10e3/uL   NEUT# 9.5 (*) 1.5 - 6.5 10e3/uL   HGB 13.7  11.6 - 15.9 g/dL   HCT 78.2  95.6 - 21.3 %   Platelets 73 (*) 145 - 400 10e3/uL   MCV 95.2  79.5 - 101.0 fL   MCH 30.8  25.1 - 34.0 pg   MCHC 32.3  31.5 - 36.0 g/dL   RBC 0.86  5.78 - 4.69 10e6/uL   RDW 13.4  11.2 - 14.5 %   lymph# 2.0  0.9 - 3.3 10e3/uL   MONO# 0.9  0.1 - 0.9 10e3/uL   Eosinophils Absolute 0.0  0.0 - 0.5 10e3/uL   Basophils Absolute 0.0  0.0 - 0.1 10e3/uL   NEUT% 75.8  38.4 - 76.8 %   LYMPH% 16.0  14.0 - 49.7 %   MONO% 7.4  0.0 - 14.0 %   EOS% 0.4  0.0 - 7.0 %   BASO% 0.4  0.0 - 2.0 %    Lab Results  Component Value Date  WBC 12.5* 08/16/2013   HGB 13.7 08/16/2013   HCT 42.3 08/16/2013   MCV 95.2 08/16/2013   PLT 73* 08/16/2013    ASSESSMENT & PLAN:  #1 steroid refractory ITP with worsening thrombocytopenia The patient has made an informed consent that she rather die than to continue on prednisone. I educated the patient signs and symptoms to watch out for bleeding. I recommend the patient to come back on a weekly basis to have her CBC checked but the patient declined. She wants to wait at least a month before she returned. She is well informed about the risk of bleeding consequences. I will see her back in a month. I recommend she continue taking prenatal vitamin. All questions were answered. The patient knows to call the clinic with any problems, questions or concerns. No barriers  to learning was detected.  I spent 15 minutes counseling the patient face to face. The total time spent in the appointment was 20 minutes and more than 50% was on counseling.     Tory Septer, MD 08/16/2013 1:16 PM

## 2013-08-16 NOTE — Telephone Encounter (Signed)
lmonvm advising the pt of her next f/u appt with dr Bertis Ruddy.

## 2013-08-23 ENCOUNTER — Other Ambulatory Visit: Payer: Self-pay | Admitting: Hematology and Oncology

## 2013-08-23 ENCOUNTER — Telehealth: Payer: Self-pay | Admitting: *Deleted

## 2013-08-23 DIAGNOSIS — D696 Thrombocytopenia, unspecified: Secondary | ICD-10-CM

## 2013-08-23 NOTE — Telephone Encounter (Signed)
Let me know when she wants to come in

## 2013-08-23 NOTE — Telephone Encounter (Signed)
I put her in at 145 pm but she can come whenever. No need to call her Chiquita Loth may not come but she has taken the 130 pm slot

## 2013-08-23 NOTE — Telephone Encounter (Signed)
Pt reports small red dots on her chin.  Asks if she needs to come in for lab appt to check platelets?

## 2013-08-23 NOTE — Telephone Encounter (Signed)
Instructed pt to come in tomorrow at 1 pm for lab and then to see Dr. Bertis Ruddy at 1:30 pm.  Pt verbalized understanding and asks if H.Pylori can be added to lab.  Dr. Bertis Ruddy agreed.  Pt also corrected that the red spots are on her shins (not chin).

## 2013-08-24 ENCOUNTER — Ambulatory Visit (HOSPITAL_BASED_OUTPATIENT_CLINIC_OR_DEPARTMENT_OTHER): Payer: 59 | Admitting: Hematology and Oncology

## 2013-08-24 ENCOUNTER — Other Ambulatory Visit (HOSPITAL_BASED_OUTPATIENT_CLINIC_OR_DEPARTMENT_OTHER): Payer: 59 | Admitting: Lab

## 2013-08-24 ENCOUNTER — Telehealth: Payer: Self-pay | Admitting: Hematology and Oncology

## 2013-08-24 ENCOUNTER — Encounter: Payer: Self-pay | Admitting: Hematology and Oncology

## 2013-08-24 VITALS — BP 124/79 | HR 94 | Temp 98.4°F | Resp 18 | Ht 66.0 in | Wt 109.2 lb

## 2013-08-24 DIAGNOSIS — D696 Thrombocytopenia, unspecified: Secondary | ICD-10-CM

## 2013-08-24 DIAGNOSIS — D693 Immune thrombocytopenic purpura: Secondary | ICD-10-CM

## 2013-08-24 LAB — CBC & DIFF AND RETIC
BASO%: 0.5 % (ref 0.0–2.0)
Basophils Absolute: 0 10e3/uL (ref 0.0–0.1)
EOS%: 1.7 % (ref 0.0–7.0)
Eosinophils Absolute: 0.1 10e3/uL (ref 0.0–0.5)
HCT: 44.1 % (ref 34.8–46.6)
HGB: 14.8 g/dL (ref 11.6–15.9)
Immature Retic Fract: 1.3 % — ABNORMAL LOW (ref 1.60–10.00)
LYMPH%: 26.2 % (ref 14.0–49.7)
MCH: 31.3 pg (ref 25.1–34.0)
MCHC: 33.6 g/dL (ref 31.5–36.0)
MCV: 93.2 fL (ref 79.5–101.0)
MONO#: 0.6 10e3/uL (ref 0.1–0.9)
MONO%: 9.4 % (ref 0.0–14.0)
NEUT#: 3.8 10e3/uL (ref 1.5–6.5)
NEUT%: 62.2 % (ref 38.4–76.8)
Platelets: 56 10e3/uL — ABNORMAL LOW (ref 145–400)
RBC: 4.73 10e6/uL (ref 3.70–5.45)
RDW: 12.4 % (ref 11.2–14.5)
Retic %: 1.05 % (ref 0.70–2.10)
Retic Ct Abs: 49.67 10e3/uL (ref 33.70–90.70)
WBC: 6.1 10e3/uL (ref 3.9–10.3)
lymph#: 1.6 10e3/uL (ref 0.9–3.3)

## 2013-08-24 NOTE — Progress Notes (Signed)
[facility] OFFICE PROGRESS NOTE  Pcp Not In System DIAGNOSIS:  Chronic ITP  SUMMARY OF HEMATOLOGIC HISTORY: This is a patient was found to have thrombocytopenia during pregnancy. Her baseline bloodwork from April 2014 was 103,000. Extensive work up including HIV panel, hepatitis, thyroid function tell test and vitamin B12 level are within normal limits. She was placed on observation until her platelet count got progressively lower and to 68,000 in April. On 06/11/2013, we decided to start her on prednisone. Her repeat CBC from last week showed her platelet count has now gone up to over 100,000. October 2014, we increased her dose to 80 mg because when we started to taper her steroids, her platelet count starts to drop. 08/03/2013 we start tapering steroid dose precipitously because the patient is suffering from side effects with crying spells,  feeling jittery and having difficulties nursing her baby. The patient has made an informed consent to continue on steroid taper. End of November 2014, she has discontinue prednisone INTERVAL HISTORY: Lindsey Ramirez 32 y.o. female returns for further followup. She noted some rash at both anterior shin area and she is concerned that might be petechiae rash. The patient denies any recent signs or symptoms of bleeding such as spontaneous epistaxis, hematuria or hematochezia.  I have reviewed the past medical history, past surgical history, social history and family history with the patient and they are unchanged from previous note.  ALLERGIES:  has No Known Allergies.  MEDICATIONS:  Current Outpatient Prescriptions  Medication Sig Dispense Refill  . PRENATAL VIT-FE FUMARATE-FA PO Take by mouth daily.       No current facility-administered medications for this visit.     REVIEW OF SYSTEMS:   Constitutional: Denies fevers, chills or night sweats Eyes: Denies blurriness of vision Ears, nose, mouth, throat, and face: Denies mucositis or  sore throat Respiratory: Denies cough, dyspnea or wheezes Cardiovascular: Denies palpitation, chest discomfort or lower extremity swelling Gastrointestinal:  Denies nausea, heartburn or change in bowel habits Skin: Denies abnormal skin rashes Lymphatics: Denies new lymphadenopathy or easy bruising Neurological:Denies numbness, tingling or new weaknesses Behavioral/Psych: Mood is stable, no new changes  All other systems were reviewed with the patient and are negative.  PHYSICAL EXAMINATION: ECOG PERFORMANCE STATUS: 0 - Asymptomatic  Filed Vitals:   08/24/13 1342  BP: 124/79  Pulse: 94  Temp: 98.4 F (36.9 C)  Resp: 18   Filed Weights   08/24/13 1342  Weight: 109 lb 3.2 oz (49.533 kg)    GENERAL:alert, no distress and comfortable SKIN: skin color, texture, turgor are normal, no rashes or significant lesions. The areas which she thought was petechiae rash was actually related to recent skin shaving. NEURO: alert & oriented x 3 with fluent speech, no focal motor/sensory deficits  LABORATORY DATA:  I have reviewed the data as listed Results for orders placed in visit on 08/24/13 (from the past 48 hour(s))  CBC & DIFF AND RETIC     Status: Abnormal   Collection Time    08/24/13  1:24 PM      Result Value Range   WBC 6.1  3.9 - 10.3 10e3/uL   NEUT# 3.8  1.5 - 6.5 10e3/uL   HGB 14.8  11.6 - 15.9 g/dL   HCT 14.7  82.9 - 56.2 %   Platelets 56 (*) 145 - 400 10e3/uL   MCV 93.2  79.5 - 101.0 fL   MCH 31.3  25.1 - 34.0 pg   MCHC 33.6  31.5 -  36.0 g/dL   RBC 1.61  0.96 - 0.45 10e6/uL   RDW 12.4  11.2 - 14.5 %   lymph# 1.6  0.9 - 3.3 10e3/uL   MONO# 0.6  0.1 - 0.9 10e3/uL   Eosinophils Absolute 0.1  0.0 - 0.5 10e3/uL   Basophils Absolute 0.0  0.0 - 0.1 10e3/uL   NEUT% 62.2  38.4 - 76.8 %   LYMPH% 26.2  14.0 - 49.7 %   MONO% 9.4  0.0 - 14.0 %   EOS% 1.7  0.0 - 7.0 %   BASO% 0.5  0.0 - 2.0 %   Retic % 1.05  0.70 - 2.10 %   Retic Ct Abs 49.67  33.70 - 90.70 10e3/uL   Immature  Retic Fract 1.30 (*) 1.60 - 10.00 %    Lab Results  Component Value Date   WBC 6.1 08/24/2013   HGB 14.8 08/24/2013   HCT 44.1 08/24/2013   MCV 93.2 08/24/2013   PLT 56* 08/24/2013   ASSESSMENT & PLAN:  #1 severe ITP Her platelet count continues to drop. I spent a lot of time today educating the patient how to monitor for bleeding. I showed her pictures of petechial rash. I discussed with her various options for severe ITP, including restarting her on prednisone, IVIG, rituximab, splenectomy, immunosuppressive therapy such as CellCept and azathioprine, as well as growth factor stimulating agents such as Promacta and Nplate. I have drawn and serology to check for H. pylori infection as requested. The patient does not require treatment today. We'll continue monitoring her platelet count carefully.  All questions were answered. The patient knows to call the clinic with any problems, questions or concerns. No barriers to learning was detected.  I spent 25 minutes counseling the patient face to face. The total time spent in the appointment was 40 minutes and more than 50% was on counseling.     [facility], Vinita Prentiss, MD 08/24/2013 2:11 PM

## 2013-08-24 NOTE — Telephone Encounter (Signed)
x

## 2013-08-25 LAB — HELICOBACTER PYLORI ABS-IGG+IGA, BLD
H Pylori IgG: <0.4 {ISR}
HELICOBACTER PYLORI AB, IGA: 1.8 U/mL (ref <9.0)

## 2013-09-18 ENCOUNTER — Other Ambulatory Visit: Payer: Self-pay | Admitting: Hematology and Oncology

## 2013-09-19 ENCOUNTER — Ambulatory Visit (HOSPITAL_BASED_OUTPATIENT_CLINIC_OR_DEPARTMENT_OTHER): Payer: 59 | Admitting: Hematology and Oncology

## 2013-09-19 ENCOUNTER — Other Ambulatory Visit (HOSPITAL_BASED_OUTPATIENT_CLINIC_OR_DEPARTMENT_OTHER): Payer: 59

## 2013-09-19 VITALS — BP 106/65 | HR 86 | Temp 98.4°F | Resp 20 | Ht 66.0 in | Wt 108.9 lb

## 2013-09-19 DIAGNOSIS — D693 Immune thrombocytopenic purpura: Secondary | ICD-10-CM

## 2013-09-19 DIAGNOSIS — D696 Thrombocytopenia, unspecified: Secondary | ICD-10-CM

## 2013-09-19 LAB — CBC WITH DIFFERENTIAL/PLATELET
Basophils Absolute: 0.1 10*3/uL (ref 0.0–0.1)
EOS%: 2.3 % (ref 0.0–7.0)
HCT: 45.5 % (ref 34.8–46.6)
HGB: 15.6 g/dL (ref 11.6–15.9)
MCH: 32 pg (ref 25.1–34.0)
MCV: 93.4 fL (ref 79.5–101.0)
MONO%: 10.8 % (ref 0.0–14.0)
NEUT%: 63.4 % (ref 38.4–76.8)
lymph#: 1.8 10*3/uL (ref 0.9–3.3)

## 2013-09-19 NOTE — Progress Notes (Signed)
[facility] OFFICE PROGRESS NOTE  Pcp Not In System DIAGNOSIS:  Chronic ITP  SUMMARY OF HEMATOLOGIC HISTORY: This is a patient was found to have thrombocytopenia during pregnancy. Her baseline bloodwork from April 2014 was 103,000. Extensive work up including HIV panel, hepatitis, thyroid function tell test and vitamin B12 level are within normal limits. She was placed on observation until her platelet count got progressively lower and to 68,000 in April. On 06/11/2013, we decided to start her on prednisone. Her repeat CBC from last week showed her platelet count has now gone up to over 100,000. October 2014, we increased her dose to 80 mg because when we started to taper her steroids, her platelet count starts to drop. 08/03/2013 we start tapering steroid dose precipitously because the patient is suffering from side effects with crying spells,  feeling jittery and having difficulties nursing her baby. The patient has made an informed consent to continue on steroid taper. End of November 2014, she has discontinue prednisone INTERVAL HISTORY: Lindsey Ramirez 32 y.o. female returns for  further followup. She denies any new skin rash. The patient denies any recent signs or symptoms of bleeding such as spontaneous epistaxis, hematuria or hematochezia. I have reviewed the past medical history, past surgical history, social history and family history with the patient and they are unchanged from previous note.  ALLERGIES:  has No Known Allergies.  MEDICATIONS:  Current Outpatient Prescriptions  Medication Sig Dispense Refill  . PRENATAL VIT-FE FUMARATE-FA PO Take by mouth daily.       No current facility-administered medications for this visit.     REVIEW OF SYSTEMS:   Constitutional: Denies fevers, chills or night sweats Behavioral/Psych: Mood is stable, no new changes  All other systems were reviewed with the patient and are negative.  PHYSICAL EXAMINATION: ECOG  PERFORMANCE STATUS: 0 - Asymptomatic  Filed Vitals:   09/19/13 1443  BP: 106/65  Pulse: 86  Temp: 98.4 F (36.9 C)  Resp: 20   Filed Weights   09/19/13 1443  Weight: 108 lb 14.4 oz (49.397 kg)    GENERAL:alert, no distress and comfortable ABDOMEN:abdomen soft, non-tender and normal bowel sounds Musculoskeletal:no cyanosis of digits and no clubbing  NEURO: alert & oriented x 3 with fluent speech, no focal motor/sensory deficits  LABORATORY DATA:  I have reviewed the data as listed Results for orders placed in visit on 09/19/13 (from the past 48 hour(s))  CBC WITH DIFFERENTIAL     Status: Abnormal   Collection Time    09/19/13  2:20 PM      Result Value Range   WBC 7.8  3.9 - 10.3 10e3/uL   NEUT# 4.9  1.5 - 6.5 10e3/uL   HGB 15.6  11.6 - 15.9 g/dL   HCT 82.9  56.2 - 13.0 %   Platelets 61 (*) 145 - 400 10e3/uL   MCV 93.4  79.5 - 101.0 fL   MCH 32.0  25.1 - 34.0 pg   MCHC 34.3  31.5 - 36.0 g/dL   RBC 8.65  7.84 - 6.96 10e6/uL   RDW 12.9  11.2 - 14.5 %   lymph# 1.8  0.9 - 3.3 10e3/uL   MONO# 0.8  0.1 - 0.9 10e3/uL   Eosinophils Absolute 0.2  0.0 - 0.5 10e3/uL   Basophils Absolute 0.1  0.0 - 0.1 10e3/uL   NEUT% 63.4  38.4 - 76.8 %   LYMPH% 22.7  14.0 - 49.7 %   MONO% 10.8  0.0 - 14.0 %  EOS% 2.3  0.0 - 7.0 %   BASO% 0.8  0.0 - 2.0 %    Lab Results  Component Value Date   WBC 7.8 09/19/2013   HGB 15.6 09/19/2013   HCT 45.5 09/19/2013   MCV 93.4 09/19/2013   PLT 61* 09/19/2013    ASSESSMENT & PLAN:  #1 chronic ITP Her platelet count is a bit improved today. The patient will continue to run a chronic course. Since we discontinued prednisone last month her platelet count is now stabilizing between 50-60,000. I recommend observation only. I will see her back in 6 months with history, physical examination and blood work. All questions were answered. The patient knows to call the clinic with any problems, questions or concerns. No barriers to learning was  detected.  I spent 15 minutes counseling the patient face to face. The total time spent in the appointment was 20 minutes and more than 50% was on counseling.     [facility], Wakisha Alberts, MD 09/19/2013 3:08 PM

## 2013-09-20 ENCOUNTER — Telehealth: Payer: Self-pay | Admitting: Hematology and Oncology

## 2013-09-20 NOTE — Telephone Encounter (Signed)
lmonvm advising the pt of her June 2015 appts. °

## 2014-03-14 ENCOUNTER — Other Ambulatory Visit: Payer: Self-pay | Admitting: Physician Assistant

## 2014-03-21 ENCOUNTER — Other Ambulatory Visit (HOSPITAL_BASED_OUTPATIENT_CLINIC_OR_DEPARTMENT_OTHER): Payer: 59

## 2014-03-21 ENCOUNTER — Ambulatory Visit (HOSPITAL_BASED_OUTPATIENT_CLINIC_OR_DEPARTMENT_OTHER): Payer: 59 | Admitting: Hematology and Oncology

## 2014-03-21 ENCOUNTER — Encounter: Payer: Self-pay | Admitting: Hematology and Oncology

## 2014-03-21 VITALS — BP 112/75 | HR 80 | Temp 98.0°F | Resp 18 | Ht 66.0 in | Wt 101.8 lb

## 2014-03-21 DIAGNOSIS — D696 Thrombocytopenia, unspecified: Secondary | ICD-10-CM

## 2014-03-21 LAB — CBC WITH DIFFERENTIAL/PLATELET
BASO%: 0.5 % (ref 0.0–2.0)
Basophils Absolute: 0 10*3/uL (ref 0.0–0.1)
EOS ABS: 0.4 10*3/uL (ref 0.0–0.5)
EOS%: 6 % (ref 0.0–7.0)
HEMATOCRIT: 43.1 % (ref 34.8–46.6)
HGB: 14.3 g/dL (ref 11.6–15.9)
LYMPH%: 30.8 % (ref 14.0–49.7)
MCH: 30.9 pg (ref 25.1–34.0)
MCHC: 33.1 g/dL (ref 31.5–36.0)
MCV: 93.5 fL (ref 79.5–101.0)
MONO#: 0.7 10*3/uL (ref 0.1–0.9)
MONO%: 11 % (ref 0.0–14.0)
NEUT%: 51.7 % (ref 38.4–76.8)
NEUTROS ABS: 3.2 10*3/uL (ref 1.5–6.5)
Platelets: 79 10*3/uL — ABNORMAL LOW (ref 145–400)
RBC: 4.62 10*6/uL (ref 3.70–5.45)
RDW: 12.9 % (ref 11.2–14.5)
WBC: 6.1 10*3/uL (ref 3.9–10.3)
lymph#: 1.9 10*3/uL (ref 0.9–3.3)

## 2014-03-21 NOTE — Assessment & Plan Note (Signed)
This is chronic, 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 She has no symptoms of increased bruising or bleeding. The patient is very reluctant to return here for future followup. I recommend minimum yearly visit just in case her platelet count started to drift lower and cause her to be at bleeding risk. She agreed to return here next year for further followup.

## 2014-03-21 NOTE — Progress Notes (Signed)
[facility] OFFICE PROGRESS NOTE  Pcp Not In System   SUMMARY OF HEMATOLOGIC HISTORY: This is a patient was found to have thrombocytopenia during pregnancy. Her baseline bloodwork from April 2014 was 103,000. Extensive work up including HIV panel, hepatitis, thyroid function tell test and vitamin B12 level are within normal limits. She was placed on observation until her platelet count got progressively lower and to 68,000 in April. On 06/11/2013, we decided to start her on prednisone. Her repeat CBC from last week showed her platelet count has now gone up to over 100,000. October 2014, we increased her dose to 80 mg because when we started to taper her steroids, her platelet count starts to drop. 08/03/2013 we start tapering steroid dose precipitously because the patient is suffering from side effects with crying spells,  feeling jittery and having difficulties nursing her baby. The patient has made an informed consent to continue on steroid taper. End of November 2014, she has discontinue prednisone INTERVAL HISTORY: Lindsey MooresJennifer Ramirez 33 y.o. female returns for further followup. The patient denies any recent signs or symptoms of bleeding such as spontaneous epistaxis, hematuria or hematochezia. Denies new skin rash. She has no menstruation since she still nursing her baby.  I have reviewed the past medical history, past surgical history, social history and family history with the patient and they are unchanged from previous note.  ALLERGIES:  has No Known Allergies.  MEDICATIONS:  Current Outpatient Prescriptions  Medication Sig Dispense Refill  . PRENATAL VIT-FE FUMARATE-FA PO Take by mouth daily.       No current facility-administered medications for this visit.     REVIEW OF SYSTEMS:   Constitutional: Denies fevers, chills or night sweats Eyes: Denies blurriness of vision Ears, nose, mouth, throat, and face: Denies mucositis or sore throat Respiratory: Denies cough,  dyspnea or wheezes Cardiovascular: Denies palpitation, chest discomfort or lower extremity swelling Gastrointestinal:  Denies nausea, heartburn or change in bowel habits Skin: Denies abnormal skin rashes Lymphatics: Denies new lymphadenopathy or easy bruising Neurological:Denies numbness, tingling or new weaknesses Behavioral/Psych: Mood is stable, no new changes  All other systems were reviewed with the patient and are negative.  PHYSICAL EXAMINATION: ECOG PERFORMANCE STATUS: 0 - Asymptomatic  Filed Vitals:   03/21/14 1041  BP: 112/75  Pulse: 80  Temp: 98 F (36.7 C)  Resp: 18   Filed Weights   03/21/14 1041  Weight: 101 lb 12.8 oz (46.176 kg)    GENERAL:alert, no distress and comfortable SKIN: skin color, texture, turgor are normal, no rashes or significant lesions EYES: normal, Conjunctiva are pink and non-injected, sclera clear OROPHARYNX:no exudate, no erythema and lips, buccal mucosa, and tongue normal  Musculoskeletal:no cyanosis of digits and no clubbing  NEURO: alert & oriented x 3 with fluent speech, no focal motor/sensory deficits  LABORATORY DATA:  I have reviewed the data as listed Results for orders placed in visit on 03/21/14 (from the past 48 hour(s))  CBC WITH DIFFERENTIAL     Status: Abnormal   Collection Time    03/21/14 10:33 AM      Result Value Ref Range   WBC 6.1  3.9 - 10.3 10e3/uL   NEUT# 3.2  1.5 - 6.5 10e3/uL   HGB 14.3  11.6 - 15.9 g/dL   HCT 96.043.1  45.434.8 - 09.846.6 %   Platelets 79 (*) 145 - 400 10e3/uL   MCV 93.5  79.5 - 101.0 fL   MCH 30.9  25.1 - 34.0 pg  MCHC 33.1  31.5 - 36.0 g/dL   RBC 0.454.62  4.093.70 - 8.115.45 10e6/uL   RDW 12.9  11.2 - 14.5 %   lymph# 1.9  0.9 - 3.3 10e3/uL   MONO# 0.7  0.1 - 0.9 10e3/uL   Eosinophils Absolute 0.4  0.0 - 0.5 10e3/uL   Basophils Absolute 0.0  0.0 - 0.1 10e3/uL   NEUT% 51.7  38.4 - 76.8 %   LYMPH% 30.8  14.0 - 49.7 %   MONO% 11.0  0.0 - 14.0 %   EOS% 6.0  0.0 - 7.0 %   BASO% 0.5  0.0 - 2.0 %    Lab  Results  Component Value Date   WBC 6.1 03/21/2014   HGB 14.3 03/21/2014   HCT 43.1 03/21/2014   MCV 93.5 03/21/2014   PLT 79* 03/21/2014   ASSESSMENT & PLAN:  Thrombocytopenia, unspecified This is chronic, 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 She has no symptoms of increased bruising or bleeding. The patient is very reluctant to return here for future followup. I recommend minimum yearly visit just in case her platelet count started to drift lower and cause her to be at bleeding risk. She agreed to return here next year for further followup.    All questions were answered. The patient knows to call the clinic with any problems, questions or concerns. No barriers to learning was detected.  I spent 15 minutes counseling the patient face to face. The total time spent in the appointment was 20 minutes and more than 50% was on counseling.     [facility] IncGORSUCH, NI, MD 03/21/2014 1:36 PM

## 2014-03-22 ENCOUNTER — Telehealth: Payer: Self-pay | Admitting: Hematology and Oncology

## 2014-03-22 NOTE — Telephone Encounter (Signed)
, °

## 2014-07-24 ENCOUNTER — Encounter: Payer: Self-pay | Admitting: Hematology and Oncology

## 2015-03-22 ENCOUNTER — Other Ambulatory Visit: Payer: Self-pay | Admitting: Hematology and Oncology

## 2015-03-22 DIAGNOSIS — D693 Immune thrombocytopenic purpura: Secondary | ICD-10-CM

## 2015-03-23 ENCOUNTER — Other Ambulatory Visit (HOSPITAL_BASED_OUTPATIENT_CLINIC_OR_DEPARTMENT_OTHER): Payer: BC Managed Care – PPO

## 2015-03-23 ENCOUNTER — Encounter: Payer: Self-pay | Admitting: Hematology and Oncology

## 2015-03-23 ENCOUNTER — Telehealth: Payer: Self-pay | Admitting: Hematology and Oncology

## 2015-03-23 ENCOUNTER — Ambulatory Visit (HOSPITAL_BASED_OUTPATIENT_CLINIC_OR_DEPARTMENT_OTHER): Payer: BC Managed Care – PPO | Admitting: Hematology and Oncology

## 2015-03-23 VITALS — BP 115/77 | HR 69 | Temp 98.1°F | Resp 18 | Ht 66.0 in | Wt 107.2 lb

## 2015-03-23 DIAGNOSIS — D693 Immune thrombocytopenic purpura: Secondary | ICD-10-CM

## 2015-03-23 LAB — CBC WITH DIFFERENTIAL/PLATELET
BASO%: 0.5 % (ref 0.0–2.0)
Basophils Absolute: 0 10*3/uL (ref 0.0–0.1)
EOS%: 5.8 % (ref 0.0–7.0)
Eosinophils Absolute: 0.3 10*3/uL (ref 0.0–0.5)
HCT: 43.4 % (ref 34.8–46.6)
HGB: 14.8 g/dL (ref 11.6–15.9)
LYMPH%: 34.6 % (ref 14.0–49.7)
MCH: 31.2 pg (ref 25.1–34.0)
MCHC: 34.1 g/dL (ref 31.5–36.0)
MCV: 91.4 fL (ref 79.5–101.0)
MONO#: 0.5 10*3/uL (ref 0.1–0.9)
MONO%: 8.8 % (ref 0.0–14.0)
NEUT%: 50.3 % (ref 38.4–76.8)
NEUTROS ABS: 3 10*3/uL (ref 1.5–6.5)
Platelets: 75 10*3/uL — ABNORMAL LOW (ref 145–400)
RBC: 4.75 10*6/uL (ref 3.70–5.45)
RDW: 12.3 % (ref 11.2–14.5)
WBC: 5.9 10*3/uL (ref 3.9–10.3)
lymph#: 2 10*3/uL (ref 0.9–3.3)

## 2015-03-23 NOTE — Progress Notes (Signed)
[facility] OFFICE PROGRESS NOTE  Pcp Not In System SUMMARY OF HEMATOLOGIC HISTORY:  This is a patient was found to have thrombocytopenia during pregnancy. Her baseline bloodwork from April 2014 was 103,000. Extensive work up including HIV panel, hepatitis, thyroid function tell test and vitamin B12 level are within normal limits. She was placed on observation until her platelet count got progressively lower and to 68,000 in April. On 06/11/2013, we decided to start her on prednisone. Her repeat CBC from last week showed her platelet count has now gone up to over 100,000. October 2014, we increased her dose to 80 mg because when we started to taper her steroids, her platelet count starts to drop. 08/03/2013 we start tapering steroid dose precipitously because the patient is suffering from side effects with crying spells,  feeling jittery and having difficulties nursing her baby. The patient has made an informed consent to continue on steroid taper. End of November 2014, she has discontinued prednisone INTERVAL HISTORY: Lindsey Ramirez 33 y.o. female returns for further follow-up. She feels well. She denies recent infection. The patient denies any recent signs or symptoms of bleeding such as spontaneous epistaxis, hematuria or hematochezia.   I have reviewed the past medical history, past surgical history, social history and family history with the patient and they are unchanged from previous note.  ALLERGIES:  has No Known Allergies.  MEDICATIONS:  Current Outpatient Prescriptions  Medication Sig Dispense Refill  . PRENATAL VIT-FE FUMARATE-FA PO Take by mouth daily.     No current facility-administered medications for this visit.     REVIEW OF SYSTEMS:   Constitutional: Denies fevers, chills or night sweats Eyes: Denies blurriness of vision Ears, nose, mouth, throat, and face: Denies mucositis or sore throat Respiratory: Denies cough, dyspnea or wheezes Cardiovascular:  Denies palpitation, chest discomfort or lower extremity swelling Gastrointestinal:  Denies nausea, heartburn or change in bowel habits Skin: Denies abnormal skin rashes Lymphatics: Denies new lymphadenopathy or easy bruising Neurological:Denies numbness, tingling or new weaknesses Behavioral/Psych: Mood is stable, no new changes  All other systems were reviewed with the patient and are negative.  PHYSICAL EXAMINATION: ECOG PERFORMANCE STATUS: 0 - Asymptomatic  Filed Vitals:   03/23/15 1046  BP: 115/77  Pulse: 69  Temp: 98.1 F (36.7 C)  Resp: 18   Filed Weights   03/23/15 1046  Weight: 107 lb 3.2 oz (48.626 kg)    GENERAL:alert, no distress and comfortable SKIN: skin color, texture, turgor are normal, no rashes or significant lesions EYES: normal, Conjunctiva are pink and non-injected, sclera clear Musculoskeletal:no cyanosis of digits and no clubbing  NEURO: alert & oriented x 3 with fluent speech, no focal motor/sensory deficits  LABORATORY DATA:  I have reviewed the data as listed Results for orders placed or performed in visit on 03/23/15 (from the past 48 hour(s))  CBC with Differential/Platelet     Status: Abnormal   Collection Time: 03/23/15 10:19 AM  Result Value Ref Range   WBC 5.9 3.9 - 10.3 10e3/uL   NEUT# 3.0 1.5 - 6.5 10e3/uL   HGB 14.8 11.6 - 15.9 g/dL   HCT 29.5 62.1 - 30.8 %   Platelets 75 (L) 145 - 400 10e3/uL   MCV 91.4 79.5 - 101.0 fL   MCH 31.2 25.1 - 34.0 pg   MCHC 34.1 31.5 - 36.0 g/dL   RBC 6.57 8.46 - 9.62 10e6/uL   RDW 12.3 11.2 - 14.5 %   lymph# 2.0 0.9 - 3.3 10e3/uL  MONO# 0.5 0.1 - 0.9 10e3/uL   Eosinophils Absolute 0.3 0.0 - 0.5 10e3/uL   Basophils Absolute 0.0 0.0 - 0.1 10e3/uL   NEUT% 50.3 38.4 - 76.8 %   LYMPH% 34.6 14.0 - 49.7 %   MONO% 8.8 0.0 - 14.0 %   EOS% 5.8 0.0 - 7.0 %   BASO% 0.5 0.0 - 2.0 %    Lab Results  Component Value Date   WBC 5.9 03/23/2015   HGB 14.8 03/23/2015   HCT 43.4 03/23/2015   MCV 91.4 03/23/2015    PLT 75* 03/23/2015    ASSESSMENT & PLAN:  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 She has no symptoms of increased bruising or bleeding. I recommend minimum yearly visit just in case her platelet count started to drift lower and cause her to be at bleeding risk. She agreed to return here next year for further follow-up unless she establish with a PCP   All questions were answered. The patient knows to call the clinic with any problems, questions or concerns. No barriers to learning was detected.  I spent 10 minutes counseling the patient face to face. The total time spent in the appointment was 15 minutes and more than 50% was on counseling.     [facility], Christee Mervine, MD 7/1/201611:03 AM

## 2015-03-23 NOTE — Assessment & Plan Note (Addendum)
This is chronic, 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 She has no symptoms of increased bruising or bleeding. I recommend minimum yearly visit just in case her platelet count started to drift lower and cause her to be at bleeding risk. She agreed to return here next year for further follow-up unless she establish with a PCP

## 2015-03-23 NOTE — Telephone Encounter (Signed)
MD already gv pt appts...mailed pt appt sched/avs and letter

## 2015-12-19 LAB — HM PAP SMEAR

## 2016-03-21 ENCOUNTER — Encounter: Payer: Self-pay | Admitting: Hematology and Oncology

## 2016-03-21 ENCOUNTER — Other Ambulatory Visit (HOSPITAL_BASED_OUTPATIENT_CLINIC_OR_DEPARTMENT_OTHER): Payer: BC Managed Care – PPO

## 2016-03-21 ENCOUNTER — Ambulatory Visit (HOSPITAL_BASED_OUTPATIENT_CLINIC_OR_DEPARTMENT_OTHER): Payer: BC Managed Care – PPO | Admitting: Hematology and Oncology

## 2016-03-21 VITALS — BP 127/65 | HR 75 | Temp 97.8°F | Resp 18 | Ht 66.0 in | Wt 111.8 lb

## 2016-03-21 DIAGNOSIS — D693 Immune thrombocytopenic purpura: Secondary | ICD-10-CM

## 2016-03-21 LAB — CBC WITH DIFFERENTIAL/PLATELET
BASO%: 0.9 % (ref 0.0–2.0)
Basophils Absolute: 0.1 10*3/uL (ref 0.0–0.1)
EOS%: 6.4 % (ref 0.0–7.0)
Eosinophils Absolute: 0.4 10*3/uL (ref 0.0–0.5)
HCT: 43.6 % (ref 34.8–46.6)
HGB: 14.4 g/dL (ref 11.6–15.9)
LYMPH%: 27.7 % (ref 14.0–49.7)
MCH: 30.8 pg (ref 25.1–34.0)
MCHC: 33 g/dL (ref 31.5–36.0)
MCV: 93.4 fL (ref 79.5–101.0)
MONO#: 0.5 10*3/uL (ref 0.1–0.9)
MONO%: 8.4 % (ref 0.0–14.0)
NEUT%: 56.6 % (ref 38.4–76.8)
NEUTROS ABS: 3.5 10*3/uL (ref 1.5–6.5)
Platelets: 99 10*3/uL — ABNORMAL LOW (ref 145–400)
RBC: 4.67 10*6/uL (ref 3.70–5.45)
RDW: 12.4 % (ref 11.2–14.5)
WBC: 6.2 10*3/uL (ref 3.9–10.3)
lymph#: 1.7 10*3/uL (ref 0.9–3.3)

## 2016-03-21 NOTE — Progress Notes (Signed)
[facility] OFFICE PROGRESS NOTE  Pcp Not In System SUMMARY OF HEMATOLOGIC HISTORY:  This is a patient was found to have thrombocytopenia during pregnancy. Her baseline bloodwork from April 2014 was 103,000. Extensive work up including HIV panel, hepatitis, thyroid function tell test and vitamin B12 level are within normal limits. She was placed on observation until her platelet count got progressively lower and to 68,000 in April. On 06/11/2013, we decided to start her on prednisone. Her repeat CBC from last week showed her platelet count has now gone up to over 100,000. October 2014, we increased her dose to 80 mg because when we started to taper her steroids, her platelet count starts to drop. 08/03/2013 we start tapering steroid dose precipitously because the patient is suffering from side effects with crying spells,  feeling jittery and having difficulties nursing her baby. The patient has made an informed consent to continue on steroid taper. End of November 2014, she has discontinued prednisone INTERVAL HISTORY: Lindsey Ramirez 35 y.o. female returns for follow-up. She is not symptomatic. The patient denies any recent signs or symptoms of bleeding such as spontaneous epistaxis, hematuria or hematochezia.   I have reviewed the past medical history, past surgical history, social history and family history with the patient and they are unchanged from previous note.  ALLERGIES:  has No Known Allergies.  MEDICATIONS:  Current Outpatient Prescriptions  Medication Sig Dispense Refill  . PRENATAL VIT-FE FUMARATE-FA PO Take by mouth daily.     No current facility-administered medications for this visit.     REVIEW OF SYSTEMS:   Constitutional: Denies fevers, chills or night sweats Eyes: Denies blurriness of vision Ears, nose, mouth, throat, and face: Denies mucositis or sore throat Respiratory: Denies cough, dyspnea or wheezes Cardiovascular: Denies palpitation, chest  discomfort or lower extremity swelling Gastrointestinal:  Denies nausea, heartburn or change in bowel habits Skin: Denies abnormal skin rashes Lymphatics: Denies new lymphadenopathy or easy bruising Neurological:Denies numbness, tingling or new weaknesses Behavioral/Psych: Mood is stable, no new changes  All other systems were reviewed with the patient and are negative.  PHYSICAL EXAMINATION: ECOG PERFORMANCE STATUS: 0 - Asymptomatic  Filed Vitals:   03/21/16 1108  BP: 127/65  Pulse: 75  Temp: 97.8 F (36.6 C)  Resp: 18   Filed Weights   03/21/16 1108  Weight: 111 lb 12.8 oz (50.712 kg)    GENERAL:alert, no distress and comfortable  LABORATORY DATA:  I have reviewed the data as listed     Component Value Date/Time   NA 141 07/28/2013 1212   K 3.7 07/28/2013 1212   CL 106 03/14/2013 1608   CO2 25 07/28/2013 1212   GLUCOSE 106 07/28/2013 1212   GLUCOSE 78 03/14/2013 1608   BUN 17.3 07/28/2013 1212   CREATININE 1.0 07/28/2013 1212   CALCIUM 10.0 07/28/2013 1212   PROT 7.2 07/28/2013 1212   ALBUMIN 4.1 07/28/2013 1212   AST 11 07/28/2013 1212   ALT 9 07/28/2013 1212   ALKPHOS 62 07/28/2013 1212   BILITOT 0.68 07/28/2013 1212    No results found for: SPEP, UPEP  Lab Results  Component Value Date   WBC 6.2 03/21/2016   NEUTROABS 3.5 03/21/2016   HGB 14.4 03/21/2016   HCT 43.6 03/21/2016   MCV 93.4 03/21/2016   PLT 99* 03/21/2016      Chemistry      Component Value Date/Time   NA 141 07/28/2013 1212   K 3.7 07/28/2013 1212   CL 106 03/14/2013  1608   CO2 25 07/28/2013 1212   BUN 17.3 07/28/2013 1212   CREATININE 1.0 07/28/2013 1212      Component Value Date/Time   CALCIUM 10.0 07/28/2013 1212   ALKPHOS 62 07/28/2013 1212   AST 11 07/28/2013 1212   ALT 9 07/28/2013 1212   BILITOT 0.68 07/28/2013 1212     ASSESSMENT & PLAN:  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 She has no symptoms of increased bruising or  bleeding.  She does not need treatment right now I recommend she continue prenatal vitamin. I recommend she establish with primary care doctor for annual CBC monitoring. I will see her back if her platelet count drifts closer to 50,000   All questions were answered. The patient knows to call the clinic with any problems, questions or concerns. No barriers to learning was detected.  I spent 5 minutes counseling the patient face to face. The total time spent in the appointment was 10 minutes and more than 50% was on counseling.     [facility] CenterGORSUCH, Avyaan Summer, MD 6/30/201711:24 AM

## 2016-03-21 NOTE — Assessment & Plan Note (Signed)
This is chronic, 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 She has no symptoms of increased bruising or bleeding.  She does not need treatment right now I recommend she continue prenatal vitamin. I recommend she establish with primary care doctor for annual CBC monitoring. I will see her back if her platelet count drifts closer to 50,000

## 2016-07-24 ENCOUNTER — Encounter: Payer: Self-pay | Admitting: Emergency Medicine

## 2016-07-25 ENCOUNTER — Ambulatory Visit (INDEPENDENT_AMBULATORY_CARE_PROVIDER_SITE_OTHER): Payer: BC Managed Care – PPO | Admitting: Family Medicine

## 2016-07-25 ENCOUNTER — Encounter: Payer: Self-pay | Admitting: Family Medicine

## 2016-07-25 VITALS — BP 110/62 | HR 72 | Temp 98.1°F | Resp 16 | Ht 66.0 in | Wt 108.4 lb

## 2016-07-25 DIAGNOSIS — R21 Rash and other nonspecific skin eruption: Secondary | ICD-10-CM | POA: Diagnosis not present

## 2016-07-25 DIAGNOSIS — Z23 Encounter for immunization: Secondary | ICD-10-CM | POA: Diagnosis not present

## 2016-07-25 MED ORDER — PREDNISONE 10 MG PO TABS
ORAL_TABLET | ORAL | 0 refills | Status: DC
Start: 2016-07-25 — End: 2016-12-18

## 2016-07-25 MED ORDER — TRIAMCINOLONE ACETONIDE 0.1 % EX CREA: 1.0000 "application " | TOPICAL_CREAM | Freq: Two times a day (BID) | CUTANEOUS | 0 refills | Status: DC

## 2016-07-25 NOTE — Patient Instructions (Signed)
Schedule your complete physical in 3-4 months Start the Prednisone as directed- start tomorrow morning w/ food Use the Triamcinolone cream twice daily on the effected areas Avoid harsh soaps, cleansers, or alcohol as this will over dry the skin If no improvement after the prednisone and the cream, please let me know and we'll send you to dermatology Call with any questions or concerns Welcome!  We're glad to have you!!!

## 2016-07-25 NOTE — Progress Notes (Signed)
   Subjective:    Patient ID: Lindsey MooresJennifer Ramirez, female    DOB: 29-Apr-1981, 35 y.o.   MRN: [id number]  HPI New to establish.  Previous MD- none recently  GYN- Adkins  Hematology- Bertis RuddyGorsuch for ITP  Facial breakout- pt has hx of adult acne.  sxs started ~11-12 days ago with what she thought was a normal breakout.  Now both sides of face are completely broken out and very itchy.  Itching improved w/ Caladryl lotion.  Pt denies any new hair or face products.  No new detergents.  No new exposures that pt is aware of.  No one at home w/ similar breakout.   Review of Systems For ROS see HPI     Objective:   Physical Exam  Constitutional: She is oriented to person, place, and time. She appears well-developed and well-nourished. No distress.  HENT:  Head: Normocephalic and atraumatic.  Neurological: She is alert and oriented to person, place, and time.  Skin: Skin is warm and dry. Rash (pt has maculopapular facial rash extending down from each temple and ending just past angle of mandible.  + excoriations.  No other rashes or lesions present) noted. No pallor.  Psychiatric: She has a normal mood and affect. Her behavior is normal. Thought content normal.  Vitals reviewed.         Assessment & Plan:  Rash- pt's facial lesions are not consistent w/ acne breakout but rather a maculopapular pruritic rash.  Pt is not aware of any new exposures or contacts but this appears to be a contact dermatitis.  Start Prednisone taper due to the diffuse distribution and topical Triamcinolone until areas improve.  Reviewed supportive care and red flags that should prompt return.  Pt expressed understanding and is in agreement w/ plan.

## 2016-07-25 NOTE — Progress Notes (Signed)
Pre visit review using our clinic review tool, if applicable. No additional management support is needed unless otherwise documented below in the visit note. 

## 2016-07-28 ENCOUNTER — Ambulatory Visit: Payer: BC Managed Care – PPO | Admitting: Family Medicine

## 2016-12-18 ENCOUNTER — Ambulatory Visit (INDEPENDENT_AMBULATORY_CARE_PROVIDER_SITE_OTHER): Payer: BC Managed Care – PPO | Admitting: Family Medicine

## 2016-12-18 ENCOUNTER — Encounter: Payer: Self-pay | Admitting: Family Medicine

## 2016-12-18 DIAGNOSIS — Z0000 Encounter for general adult medical examination without abnormal findings: Secondary | ICD-10-CM | POA: Insufficient documentation

## 2016-12-18 LAB — BASIC METABOLIC PANEL
BUN: 17 mg/dL (ref 6–23)
CHLORIDE: 103 meq/L (ref 96–112)
CO2: 30 mEq/L (ref 19–32)
Calcium: 9.3 mg/dL (ref 8.4–10.5)
Creatinine, Ser: 1.01 mg/dL (ref 0.40–1.20)
GFR: 65.84 mL/min (ref >60.00)
Glucose, Bld: 86 mg/dL (ref 70–99)
POTASSIUM: 4.3 meq/L (ref 3.5–5.1)
Sodium: 139 mEq/L (ref 135–145)

## 2016-12-18 LAB — HEPATIC FUNCTION PANEL
ALK PHOS: 41 U/L (ref 39–117)
ALT: 10 U/L (ref 0–35)
AST: 15 U/L (ref 0–37)
Albumin: 4.4 g/dL (ref 3.5–5.2)
BILIRUBIN TOTAL: 0.7 mg/dL (ref 0.2–1.2)
Bilirubin, Direct: 0.1 mg/dL (ref 0.0–0.3)
Total Protein: 7.4 g/dL (ref 6.0–8.3)

## 2016-12-18 LAB — CBC WITH DIFFERENTIAL/PLATELET
Basophils Absolute: 0.1 10*3/uL (ref 0.0–0.1)
Basophils Relative: 1.2 % (ref 0.0–3.0)
EOS PCT: 6.4 % — AB (ref 0.0–5.0)
Eosinophils Absolute: 0.3 10*3/uL (ref 0.0–0.7)
HEMATOCRIT: 48 % — AB (ref 36.0–46.0)
Hemoglobin: 16.1 g/dL — ABNORMAL HIGH (ref 12.0–15.0)
LYMPHS ABS: 1.4 10*3/uL (ref 0.7–4.0)
LYMPHS PCT: 25.7 % (ref 12.0–46.0)
MCHC: 33.6 g/dL (ref 30.0–36.0)
MCV: 95.6 fl (ref 78.0–100.0)
MONOS PCT: 7.8 % (ref 3.0–12.0)
Monocytes Absolute: 0.4 10*3/uL (ref 0.1–1.0)
NEUTROS ABS: 3.2 10*3/uL (ref 1.4–7.7)
Neutrophils Relative %: 58.9 % (ref 43.0–77.0)
PLATELETS: 99 10*3/uL — AB (ref 150.0–400.0)
RBC: 5.02 Mil/uL (ref 3.87–5.11)
RDW: 13 % (ref 11.5–15.5)
WBC: 5.4 10*3/uL (ref 4.0–10.5)

## 2016-12-18 LAB — LIPID PANEL
CHOLESTEROL: 188 mg/dL (ref 0–200)
HDL: 56.2 mg/dL (ref >39.00)
LDL Cholesterol: 110 mg/dL — ABNORMAL HIGH (ref 0–99)
NONHDL: 131.79
Total CHOL/HDL Ratio: 3
Triglycerides: 111 mg/dL (ref 0.0–149.0)
VLDL: 22.2 mg/dL (ref 0.0–40.0)

## 2016-12-18 LAB — TSH: TSH: 2.36 u[IU]/mL (ref 0.35–4.50)

## 2016-12-18 LAB — VITAMIN D 25 HYDROXY (VIT D DEFICIENCY, FRACTURES): VITD: 71.51 ng/mL (ref 30.00–100.00)

## 2016-12-18 NOTE — Patient Instructions (Signed)
Follow up in 1 year or as needed We'll notify you of your lab results and make any changes if needed Keep up the good work!  You look great! Call with any questions or concerns Happy Odis Lusteraster!!!

## 2016-12-18 NOTE — Progress Notes (Signed)
   Subjective:    Patient ID: Lindsey Ramirez, female    DOB: 1981/03/22, 36 y.o.   MRN: [id number]  HPI CPE- UTD on GYN.  No concerns today   Review of Systems Patient reports no vision/ hearing changes, adenopathy,fever, weight change,  persistant/recurrent hoarseness , swallowing issues, chest pain, palpitations, edema, persistant/recurrent cough, hemoptysis, dyspnea (rest/exertional/paroxysmal nocturnal), gastrointestinal bleeding (melena, rectal bleeding), abdominal pain, significant heartburn, bowel changes, GU symptoms (dysuria, hematuria, incontinence), Gyn symptoms (abnormal  bleeding, pain),  syncope, focal weakness, memory loss, numbness & tingling, skin/hair/nail changes, abnormal bruising or bleeding, anxiety, or depression.     Objective:   Physical Exam General Appearance:    Alert, cooperative, no distress, appears stated age  Head:    Normocephalic, without obvious abnormality, atraumatic  Eyes:    PERRL, conjunctiva/corneas clear, EOM's intact, fundi    benign, both eyes  Ears:    Normal TM's and external ear canals, both ears  Nose:   Nares normal, septum midline, mucosa normal, no drainage    or sinus tenderness  Throat:   Lips, mucosa, and tongue normal; teeth and gums normal  Neck:   Supple, symmetrical, trachea midline, no adenopathy;    Thyroid: no enlargement/tenderness/nodules  Back:     Symmetric, no curvature, ROM normal, no CVA tenderness  Lungs:     Clear to auscultation bilaterally, respirations unlabored  Chest Wall:    No tenderness or deformity   Heart:    Regular rate and rhythm, S1 and S2 normal, no murmur, rub   or gallop  Breast Exam:    Deferred to GYN  Abdomen:     Soft, non-tender, bowel sounds active all four quadrants,    no masses, no organomegaly  Genitalia:    Deferred to GYN  Rectal:    Extremities:   Extremities normal, atraumatic, no cyanosis or edema  Pulses:   2+ and symmetric all extremities  Skin:   Skin color, texture, turgor  normal, no rashes or lesions  Lymph nodes:   Cervical, supraclavicular, and axillary nodes normal  Neurologic:   CNII-XII intact, normal strength, sensation and reflexes    throughout          Assessment & Plan:

## 2016-12-18 NOTE — Assessment & Plan Note (Signed)
Pt's PE WNL.  UTD on GYN.  Check labs.  Anticipatory guidance provided.  

## 2016-12-18 NOTE — Progress Notes (Signed)
Pre visit review using our clinic review tool, if applicable. No additional management support is needed unless otherwise documented below in the visit note. 

## 2017-03-24 ENCOUNTER — Telehealth: Payer: Self-pay | Admitting: *Deleted

## 2017-03-24 NOTE — Telephone Encounter (Signed)
Patient states that she has had a sharp shooting pain that feels like it's coming from the rectal area and shooting around to the lower abdomen.   It does not last long when it hits, and sometimes happens 1-2 times a day. When it hits she states that the pain is very intense and there is no "reason" to it, and no pattern to when it happens.    Patient is going out of town today until Sunday, then she is leaving for [place] on Tuesday.   She is coming in for an appointment Monday morning - but she is curious if there is anything that she needs to do between now and then.

## 2017-03-24 NOTE — Telephone Encounter (Signed)
Patient has been informed of information below.  Stated verbal understanding.

## 2017-03-24 NOTE — Telephone Encounter (Signed)
Since I have not assessed her that is hard to say. Can be pelvic or rectal muscle spasm. Recommend she keep well hydrated and try to avoid straining with bowel movement. Not much else to advise her own until it is assessed. Recommend Urgent Care assessment if any new or worsening symptoms while she is out of town before Monday.

## 2017-03-30 ENCOUNTER — Ambulatory Visit (INDEPENDENT_AMBULATORY_CARE_PROVIDER_SITE_OTHER): Payer: BC Managed Care – PPO | Admitting: Family Medicine

## 2017-03-30 ENCOUNTER — Encounter: Payer: Self-pay | Admitting: Family Medicine

## 2017-03-30 DIAGNOSIS — K594 Anal spasm: Secondary | ICD-10-CM | POA: Diagnosis not present

## 2017-03-30 NOTE — Progress Notes (Signed)
   Subjective:    Patient ID: Lindsey Ramirez, female    DOB: 1981-02-13, 36 y.o.   MRN: [id number]  HPI Rectal spasm- pt reports she had a week of sxs but they resolved with tylenol.  Pt was having sporadic pain- sometimes it would wake her from sleep, other times when sitting. sxs would only last seconds but could string together. No issues w/ constipation.  No N/V.  Last spasm was 7/4.  Leaves for vacation tomorrow.   Review of Systems For ROS see HPI     Objective:   Physical Exam  Constitutional: She is oriented to person, place, and time. She appears well-developed and well-nourished. No distress.  HENT:  Head: Normocephalic and atraumatic.  Abdominal: Soft. Bowel sounds are normal. She exhibits no distension. There is no tenderness. There is no rebound and no guarding.  Genitourinary:  Genitourinary Comments: Deferred at pt's request due to lack of sxs  Neurological: She is alert and oriented to person, place, and time.  Skin: Skin is warm and dry.  Psychiatric: She has a normal mood and affect. Her behavior is normal. Thought content normal.  Vitals reviewed.         Assessment & Plan:

## 2017-03-30 NOTE — Patient Instructions (Signed)
Follow up as needed What you're describing is a rectal spasm.  This can improve w/ tylenol/ibuprofen as needed If symptoms do not improve w/ OTC meds, please call or MyChart and we can send something in for you Call with any questions or concerns Enjoy your vacation!!!

## 2017-03-30 NOTE — Assessment & Plan Note (Signed)
New.  Pt has been asymptomatic for 5 days.  Declined rectal exam today.  Reviewed dx and tx options.  Pt expressed understanding and is in agreement w/ plan.

## 2017-03-30 NOTE — Progress Notes (Signed)
Pre visit review using our clinic review tool, if applicable. No additional management support is needed unless otherwise documented below in the visit note. 

## 2017-12-23 ENCOUNTER — Telehealth: Payer: Self-pay | Admitting: Family Medicine

## 2017-12-23 NOTE — Telephone Encounter (Signed)
Pt needs to schedule a CPE as she was due in March and her hematologist previously recommended annual monitoring of CBC.

## 2017-12-23 NOTE — Telephone Encounter (Signed)
Called pt and left a detailed message to advise of PCP recommendations to schedule a CPE.   [facility] / [facility] recommendations / Schedule patient.

## 2017-12-23 NOTE — Telephone Encounter (Signed)
Copied from CRM [phone]. Topic: Appointment Scheduling - Scheduling Inquiry for Clinic >> Dec 23, 2017  1:52 PM Windy KalataMichael, Onyinyechi Huante L, NT wrote:  Patient is calling to schedule a lab she states it deals with her platelets. She states she has a blood disorder but I do not see any orders. Please advise.

## 2017-12-23 NOTE — Telephone Encounter (Signed)
I looked in pt chart at last CPE 11/2016 pt hemoglobin was abnormal. Have you received anything else in regards to pt?

## 2018-01-11 ENCOUNTER — Ambulatory Visit (INDEPENDENT_AMBULATORY_CARE_PROVIDER_SITE_OTHER): Payer: BC Managed Care – PPO | Admitting: Physician Assistant

## 2018-01-11 ENCOUNTER — Other Ambulatory Visit: Payer: Self-pay

## 2018-01-11 ENCOUNTER — Encounter: Payer: Self-pay | Admitting: Physician Assistant

## 2018-01-11 VITALS — BP 110/72 | HR 74 | Temp 98.6°F | Resp 14 | Ht 66.0 in | Wt 113.0 lb

## 2018-01-11 DIAGNOSIS — D693 Immune thrombocytopenic purpura: Secondary | ICD-10-CM

## 2018-01-11 DIAGNOSIS — Z0000 Encounter for general adult medical examination without abnormal findings: Secondary | ICD-10-CM | POA: Diagnosis not present

## 2018-01-11 LAB — LIPID PANEL
CHOL/HDL RATIO: 3
CHOLESTEROL: 139 mg/dL (ref 0–200)
HDL: 53.1 mg/dL (ref >39.00)
LDL Cholesterol: 69 mg/dL (ref 0–99)
NonHDL: 85.65
TRIGLYCERIDES: 84 mg/dL (ref 0.0–149.0)
VLDL: 16.8 mg/dL (ref 0.0–40.0)

## 2018-01-11 LAB — CBC WITH DIFFERENTIAL/PLATELET
BASOS ABS: 0 10*3/uL (ref 0.0–0.1)
Basophils Relative: 0.5 % (ref 0.0–3.0)
Eosinophils Absolute: 0.3 10*3/uL (ref 0.0–0.7)
Eosinophils Relative: 5.3 % — ABNORMAL HIGH (ref 0.0–5.0)
HCT: 42.8 % (ref 36.0–46.0)
Hemoglobin: 14.6 g/dL (ref 12.0–15.0)
LYMPHS ABS: 1.2 10*3/uL (ref 0.7–4.0)
Lymphocytes Relative: 22.5 % (ref 12.0–46.0)
MCHC: 34.1 g/dL (ref 30.0–36.0)
MCV: 95.7 fl (ref 78.0–100.0)
MONOS PCT: 6.2 % (ref 3.0–12.0)
Monocytes Absolute: 0.3 10*3/uL (ref 0.1–1.0)
NEUTROS ABS: 3.5 10*3/uL (ref 1.4–7.7)
NEUTROS PCT: 65.5 % (ref 43.0–77.0)
PLATELETS: 96 10*3/uL — AB (ref 150.0–400.0)
RBC: 4.48 Mil/uL (ref 3.87–5.11)
RDW: 12.5 % (ref 11.5–15.5)
WBC: 5.4 10*3/uL (ref 4.0–10.5)

## 2018-01-11 LAB — COMPREHENSIVE METABOLIC PANEL
ALT: 29 U/L (ref 0–35)
AST: 35 U/L (ref 0–37)
Albumin: 4 g/dL (ref 3.5–5.2)
Alkaline Phosphatase: 31 U/L — ABNORMAL LOW (ref 39–117)
BUN: 13 mg/dL (ref 6–23)
CHLORIDE: 105 meq/L (ref 96–112)
CO2: 25 meq/L (ref 19–32)
Calcium: 8.8 mg/dL (ref 8.4–10.5)
Creatinine, Ser: 0.95 mg/dL (ref 0.40–1.20)
GFR: 70.25 mL/min (ref >60.00)
GLUCOSE: 90 mg/dL (ref 70–99)
POTASSIUM: 3.9 meq/L (ref 3.5–5.1)
Sodium: 139 mEq/L (ref 135–145)
Total Bilirubin: 0.4 mg/dL (ref 0.2–1.2)
Total Protein: 6.7 g/dL (ref 6.0–8.3)

## 2018-01-11 LAB — TSH: TSH: 2.39 u[IU]/mL (ref 0.35–4.50)

## 2018-01-11 NOTE — Assessment & Plan Note (Signed)
Depression screen negative. Health Maintenance reviewed. Preventive schedule discussed and handout given in AVS. Will obtain fasting labs today.  

## 2018-01-11 NOTE — Progress Notes (Signed)
Patient presents to clinic today for annual exam.  Patient is fasting for labs.  Diet -- Endorses well-balanced diet overall. Notes she needs to get in more protein.  Exercise -- Patient goes to the gym several times per week.   Acute Concerns: Denies acute concerns at today's visit.   Health Maintenance: Immunizations -- up-to-date. PAP -- Last PAP in 2017.   Past Medical History:  Diagnosis Date  . Thrombocytopenia, unspecified (HCC) 06/13/2013    Past Surgical History:  Procedure Laterality Date  . TONSILLECTOMY AND ADENOIDECTOMY      Current Outpatient Medications on File Prior to Visit  Medication Sig Dispense Refill  . Cholecalciferol (VITAMIN D PO) Take by mouth.    . CRYSELLE-28 0.3-30 MG-MCG tablet     . PRENATAL VIT-FE FUMARATE-FA PO Take by mouth daily.    Marland Kitchen. triamcinolone cream (KENALOG) 0.1 % Apply 1 application topically 2 (two) times daily. 80 g 0   No current facility-administered medications on file prior to visit.     No Known Allergies  Family History  Problem Relation Age of Onset  . Healthy Mother   . Healthy Father   . Healthy Sister   . Cancer Paternal Aunt        breast  . Glaucoma Maternal Grandmother     Social History   Socioeconomic History  . Marital status: Married    Spouse name: Not on file  . Number of children: 0  . Years of education: Not on file  . Highest education level: Not on file  Occupational History    Comment: teacher, kindergarden.   Social Needs  . Financial resource strain: Not on file  . Food insecurity:    Worry: Not on file    Inability: Not on file  . Transportation needs:    Medical: Not on file    Non-medical: Not on file  Tobacco Use  . Smoking status: Never Smoker  . Smokeless tobacco: Never Used  Substance and Sexual Activity  . Alcohol use: No  . Drug use: No  . Sexual activity: Not on file  Lifestyle  . Physical activity:    Days per week: Not on file    Minutes per session: Not on  file  . Stress: Not on file  Relationships  . Social connections:    Talks on phone: Not on file    Gets together: Not on file    Attends religious service: Not on file    Active member of club or organization: Not on file    Attends meetings of clubs or organizations: Not on file    Relationship status: Not on file  . Intimate partner violence:    Fear of current or ex partner: Not on file    Emotionally abused: Not on file    Physically abused: Not on file    Forced sexual activity: Not on file  Other Topics Concern  . Not on file  Social History Narrative  . Not on file    Review of Systems  Constitutional: Negative for fever and weight loss.  HENT: Negative for ear discharge, ear pain, hearing loss and tinnitus.   Eyes: Negative for blurred vision, double vision, photophobia and pain.  Respiratory: Negative for cough and shortness of breath.   Cardiovascular: Negative for chest pain and palpitations.  Gastrointestinal: Negative for abdominal pain, blood in stool, constipation, diarrhea, heartburn, melena, nausea and vomiting.  Genitourinary: Negative for dysuria, flank pain, frequency, hematuria and urgency.  Musculoskeletal: Negative for falls.  Neurological: Negative for dizziness, loss of consciousness and headaches.  Endo/Heme/Allergies: Negative for environmental allergies.  Psychiatric/Behavioral: Negative for depression, hallucinations, substance abuse and suicidal ideas. The patient is not nervous/anxious and does not have insomnia.    BP 110/72   Pulse 74   Temp 98.6 F (37 C) (Oral)   Resp 14   Ht 5\' 6"  (1.676 m)   Wt 113 lb (51.3 kg)   SpO2 99%   Breastfeeding? No   BMI 18.24 kg/m   Physical Exam  Constitutional: She is oriented to person, place, and time.  HENT:  Head: Normocephalic and atraumatic.  Right Ear: Tympanic membrane, external ear and ear canal normal.  Left Ear: Tympanic membrane, external ear and ear canal normal.  Nose: Nose normal. No  mucosal edema.  Mouth/Throat: Uvula is midline, oropharynx is clear and moist and mucous membranes are normal. No oropharyngeal exudate or posterior oropharyngeal erythema.  Eyes: Pupils are equal, round, and reactive to light. Conjunctivae are normal.  Neck: Neck supple. No thyromegaly present.  Cardiovascular: Normal rate, regular rhythm, normal heart sounds and intact distal pulses.  Pulmonary/Chest: Effort normal and breath sounds normal. No respiratory distress. She has no wheezes. She has no rales.  Abdominal: Soft. Bowel sounds are normal. She exhibits no distension and no mass. There is no tenderness. There is no rebound and no guarding.  Lymphadenopathy:    She has no cervical adenopathy.  Neurological: She is alert and oriented to person, place, and time. No cranial nerve deficit.  Skin: Skin is warm and dry. No rash noted.  Vitals reviewed.  Assessment/Plan: Visit for preventive health examination Depression screen negative. Health Maintenance reviewed. Preventive schedule discussed and handout given in AVS. Will obtain fasting labs today.   Idiopathic thrombocytopenic purpura (HCC) Repeat CBC today to assess. Patient denies change in symptoms.     Piedad Climes, PA-C

## 2018-01-11 NOTE — Assessment & Plan Note (Signed)
Repeat CBC today to assess. Patient denies change in symptoms.

## 2018-01-11 NOTE — Patient Instructions (Signed)
Please go to the lab for blood work.   Our office will call you with your results unless you have chosen to receive results via MyChart.  If your blood work is normal we will follow-up each year for physicals and as scheduled for chronic medical problems.  If anything is abnormal we will treat accordingly and get you in for a follow-up.   Preventive Care 18-39 Years, Female Preventive care refers to lifestyle choices and visits with your health care provider that can promote health and wellness. What does preventive care include?  A yearly physical exam. This is also called an annual well check.  Dental exams once or twice a year.  Routine eye exams. Ask your health care provider how often you should have your eyes checked.  Personal lifestyle choices, including: ? Daily care of your teeth and gums. ? Regular physical activity. ? Eating a healthy diet. ? Avoiding tobacco and drug use. ? Limiting alcohol use. ? Practicing safe sex. ? Taking vitamin and mineral supplements as recommended by your health care provider. What happens during an annual well check? The services and screenings done by your health care provider during your annual well check will depend on your age, overall health, lifestyle risk factors, and family history of disease. Counseling Your health care provider may ask you questions about your:  Alcohol use.  Tobacco use.  Drug use.  Emotional well-being.  Home and relationship well-being.  Sexual activity.  Eating habits.  Work and work Statistician.  Method of birth control.  Menstrual cycle.  Pregnancy history.  Screening You may have the following tests or measurements:  Height, weight, and BMI.  Diabetes screening. This is done by checking your blood sugar (glucose) after you have not eaten for a while (fasting).  Blood pressure.  Lipid and cholesterol levels. These may be checked every 5 years starting at age 55.  Skin  check.  Hepatitis C blood test.  Hepatitis B blood test.  Sexually transmitted disease (STD) testing.  BRCA-related cancer screening. This may be done if you have a family history of breast, ovarian, tubal, or peritoneal cancers.  Pelvic exam and Pap test. This may be done every 3 years starting at age 46. Starting at age 12, this may be done every 5 years if you have a Pap test in combination with an HPV test.  Discuss your test results, treatment options, and if necessary, the need for more tests with your health care provider. Vaccines Your health care provider may recommend certain vaccines, such as:  Influenza vaccine. This is recommended every year.  Tetanus, diphtheria, and acellular pertussis (Tdap, Td) vaccine. You may need a Td booster every 10 years.  Varicella vaccine. You may need this if you have not been vaccinated.  HPV vaccine. If you are 39 or younger, you may need three doses over 6 months.  Measles, mumps, and rubella (MMR) vaccine. You may need at least one dose of MMR. You may also need a second dose.  Pneumococcal 13-valent conjugate (PCV13) vaccine. You may need this if you have certain conditions and were not previously vaccinated.  Pneumococcal polysaccharide (PPSV23) vaccine. You may need one or two doses if you smoke cigarettes or if you have certain conditions.  Meningococcal vaccine. One dose is recommended if you are age 13-21 years and a first-year college student living in a residence hall, or if you have one of several medical conditions. You may also need additional booster doses.  Hepatitis A vaccine.  You may need this if you have certain conditions or if you travel or work in places where you may be exposed to hepatitis A.  Hepatitis B vaccine. You may need this if you have certain conditions or if you travel or work in places where you may be exposed to hepatitis B.  Haemophilus influenzae type b (Hib) vaccine. You may need this if you have  certain risk factors.  Talk to your health care provider about which screenings and vaccines you need and how often you need them. This information is not intended to replace advice given to you by your health care provider. Make sure you discuss any questions you have with your health care provider. Document Released: 11/04/2001 Document Revised: 05/28/2016 Document Reviewed: 07/10/2015 Elsevier Interactive Patient Education  [employer]. .

## 2018-07-27 ENCOUNTER — Telehealth: Payer: Self-pay | Admitting: Family Medicine

## 2018-07-27 MED ORDER — VALACYCLOVIR HCL 1 G PO TABS: 2000.0000 mg | ORAL_TABLET | Freq: Two times a day (BID) | ORAL | 0 refills | Status: DC

## 2018-07-27 NOTE — Telephone Encounter (Signed)
Ok for Valtrex 1000mg - 2 tabs q 12 x2 doses (4 tabs total).  Save remainder for future outbreaks.  #20, 1 refill Also add OTC Lysine supplement to prevent future outbreaks

## 2018-07-27 NOTE — Telephone Encounter (Signed)
Medication filled to pharmacy as requested.  And called pt and left a detailed message to advise.

## 2018-07-27 NOTE — Telephone Encounter (Signed)
Copied from CRM [id number]. Topic: General - Inquiry >> Jul 27, 2018  8:39 AM Maia Petties wrote: Reason for CRM: pt has a cold and has a cold sore on her nose/lip. She said this happens a couple times every cold season. She is not wanting to come in for appt since so many people in the offices are sick right now. Requesting RX for cold sore as abreva is not helping much. If no RX will be sent is there something else that can be recommended OTC that is stronger.   [facility] [address] [place] - [address] [phone] (Phone) [phone] (Fax)

## 2019-03-18 ENCOUNTER — Encounter: Payer: Self-pay | Admitting: Family Medicine

## 2019-03-18 ENCOUNTER — Ambulatory Visit (INDEPENDENT_AMBULATORY_CARE_PROVIDER_SITE_OTHER): Payer: BC Managed Care – PPO | Admitting: Family Medicine

## 2019-03-18 ENCOUNTER — Other Ambulatory Visit: Payer: Self-pay

## 2019-03-18 VITALS — BP 103/73 | HR 78 | Temp 97.9°F | Resp 16 | Ht 66.0 in | Wt 112.1 lb

## 2019-03-18 DIAGNOSIS — D693 Immune thrombocytopenic purpura: Secondary | ICD-10-CM | POA: Diagnosis not present

## 2019-03-18 DIAGNOSIS — Z0000 Encounter for general adult medical examination without abnormal findings: Secondary | ICD-10-CM

## 2019-03-18 DIAGNOSIS — R636 Underweight: Secondary | ICD-10-CM | POA: Diagnosis not present

## 2019-03-18 LAB — LIPID PANEL
Cholesterol: 175 mg/dL (ref 0–200)
HDL: 58.4 mg/dL (ref >39.00)
LDL Cholesterol: 101 mg/dL — ABNORMAL HIGH (ref 0–99)
NonHDL: 117
Total CHOL/HDL Ratio: 3
Triglycerides: 79 mg/dL (ref 0.0–149.0)
VLDL: 15.8 mg/dL (ref 0.0–40.0)

## 2019-03-18 LAB — CBC WITH DIFFERENTIAL/PLATELET
Basophils Absolute: 0.1 10*3/uL (ref 0.0–0.1)
Basophils Relative: 1.1 % (ref 0.0–3.0)
Eosinophils Absolute: 0.2 10*3/uL (ref 0.0–0.7)
Eosinophils Relative: 4.4 % (ref 0.0–5.0)
HCT: 43.8 % (ref 36.0–46.0)
Hemoglobin: 14.7 g/dL (ref 12.0–15.0)
Lymphocytes Relative: 26.4 % (ref 12.0–46.0)
Lymphs Abs: 1.3 10*3/uL (ref 0.7–4.0)
MCHC: 33.6 g/dL (ref 30.0–36.0)
MCV: 96 fl (ref 78.0–100.0)
Monocytes Absolute: 0.4 10*3/uL (ref 0.1–1.0)
Monocytes Relative: 7.7 % (ref 3.0–12.0)
Neutro Abs: 3.1 10*3/uL (ref 1.4–7.7)
Neutrophils Relative %: 60.4 % (ref 43.0–77.0)
Platelets: 75 10*3/uL — ABNORMAL LOW (ref 150.0–400.0)
RBC: 4.56 Mil/uL (ref 3.87–5.11)
RDW: 12.3 % (ref 11.5–15.5)
WBC: 5.1 10*3/uL (ref 4.0–10.5)

## 2019-03-18 LAB — BASIC METABOLIC PANEL
BUN: 18 mg/dL (ref 6–23)
CO2: 25 mEq/L (ref 19–32)
Calcium: 8.8 mg/dL (ref 8.4–10.5)
Chloride: 103 mEq/L (ref 96–112)
Creatinine, Ser: 1.12 mg/dL (ref 0.40–1.20)
GFR: 54.31 mL/min — ABNORMAL LOW (ref >60.00)
Glucose, Bld: 77 mg/dL (ref 70–99)
Potassium: 3.9 mEq/L (ref 3.5–5.1)
Sodium: 138 mEq/L (ref 135–145)

## 2019-03-18 LAB — HEPATIC FUNCTION PANEL
ALT: 15 U/L (ref 0–35)
AST: 20 U/L (ref 0–37)
Albumin: 4.4 g/dL (ref 3.5–5.2)
Alkaline Phosphatase: 44 U/L (ref 39–117)
Bilirubin, Direct: 0.1 mg/dL (ref 0.0–0.3)
Total Bilirubin: 0.7 mg/dL (ref 0.2–1.2)
Total Protein: 7 g/dL (ref 6.0–8.3)

## 2019-03-18 LAB — TSH: TSH: 1.79 u[IU]/mL (ref 0.35–4.50)

## 2019-03-18 NOTE — Assessment & Plan Note (Signed)
Chronic problem.  Was previously following w/ Dr Alvy Bimler.  Check CBC today and determine if referral back to heme is warranted.  Pt expressed understanding and is in agreement w/ plan.

## 2019-03-18 NOTE — Patient Instructions (Signed)
Follow up in 1 year or as needed We'll notify you of your lab results and make any changes if needed Keep up the good work!  You look great! Please have your GYN send me a copy of your pap Call with any questions or concerns Stay Safe!!

## 2019-03-18 NOTE — Assessment & Plan Note (Signed)
Pt's PE WNL w/ exception of being mildly underweight.  Has pap scheduled.  Check labs.  Anticipatory guidance provided.

## 2019-03-18 NOTE — Progress Notes (Signed)
   Subjective:    Patient ID: Lindsey Ramirez, female    DOB: [birth year], 38 y.o.   MRN: [id number]  HPI CPE- has pap scheduled.  UTD on immunizations.     Review of Systems Patient reports no vision/ hearing changes, adenopathy,fever, weight change,  persistant/recurrent hoarseness , swallowing issues, chest pain, palpitations, edema, persistant/recurrent cough, hemoptysis, dyspnea (rest/exertional/paroxysmal nocturnal), gastrointestinal bleeding (melena, rectal bleeding), abdominal pain, significant heartburn, bowel changes, GU symptoms (dysuria, hematuria, incontinence), Gyn symptoms (abnormal  bleeding, pain),  syncope, focal weakness, memory loss, numbness & tingling, skin/hair/nail changes, abnormal bruising or bleeding, anxiety, or depression.     Objective:   Physical Exam General Appearance:    Alert, cooperative, no distress, appears stated age  Head:    Normocephalic, without obvious abnormality, atraumatic  Eyes:    PERRL, conjunctiva/corneas clear, EOM's intact, fundi    benign, both eyes  Ears:    Normal TM's and external ear canals, both ears  Nose:   Nares normal, septum midline, mucosa normal, no drainage    or sinus tenderness  Throat:   Lips, mucosa, and tongue normal; teeth and gums normal  Neck:   Supple, symmetrical, trachea midline, no adenopathy;    Thyroid: no enlargement/tenderness/nodules  Back:     Symmetric, no curvature, ROM normal, no CVA tenderness  Lungs:     Clear to auscultation bilaterally, respirations unlabored  Chest Wall:    No tenderness or deformity   Heart:    Regular rate and rhythm, S1 and S2 normal, no murmur, rub   or gallop  Breast Exam:    Deferred to GYN  Abdomen:     Soft, non-tender, bowel sounds active all four quadrants,    no masses, no organomegaly  Genitalia:    Deferred to GYN  Rectal:    Extremities:   Extremities normal, atraumatic, no cyanosis or edema  Pulses:   2+ and symmetric all extremities  Skin:   Skin color,  texture, turgor normal, no rashes or lesions  Lymph nodes:   Cervical, supraclavicular, and axillary nodes normal  Neurologic:   CNII-XII intact, normal strength, sensation and reflexes    throughout          Assessment & Plan:   Mildly underweight- encouraged pt to eat regularly and increase caloric intake.  Weight has been stable.  Will continue to follow.

## 2019-07-21 ENCOUNTER — Telehealth: Payer: Self-pay | Admitting: *Deleted

## 2019-07-21 MED ORDER — VALACYCLOVIR HCL 1 G PO TABS: 2000.0000 mg | ORAL_TABLET | Freq: Two times a day (BID) | ORAL | 0 refills | Status: DC

## 2019-07-21 NOTE — Telephone Encounter (Signed)
Patient got up this morning and has a cold sore on her nose/lip. She said this happens a couple times every cold season. She is not wanting to come in for appt and states that this is common for her. She is asking if we can send in a refill on the Valtrex to the [facility] in [place]

## 2019-07-21 NOTE — Telephone Encounter (Signed)
Ok for Valtrex refill

## 2019-07-21 NOTE — Addendum Note (Signed)
Addended by: Davis Gourd on: 07/21/2019 09:34 AM   Modules accepted: Orders

## 2019-07-21 NOTE — Telephone Encounter (Signed)
Medication filled to pharmacy as requested.   

## 2019-11-17 ENCOUNTER — Telehealth: Payer: Self-pay

## 2019-11-17 NOTE — Telephone Encounter (Signed)
Called and left below message that it is okay to get the Covid vaccine.

## 2019-11-17 NOTE — Telephone Encounter (Signed)
-----   Message from Artis Delay, MD sent at 11/17/2019  7:23 AM EST ----- Regarding: RE: COVID Vaccine yes ----- Message ----- From: Morrell Riddle, RN Sent: 11/16/2019  12:52 PM EST To: Artis Delay, MD Subject: COVID Vaccine                                  Is it okay for her to get the COVID vaccine? She left a message.  Thanks

## 2019-11-27 ENCOUNTER — Ambulatory Visit: Payer: BC Managed Care – PPO

## 2020-03-22 ENCOUNTER — Ambulatory Visit (INDEPENDENT_AMBULATORY_CARE_PROVIDER_SITE_OTHER): Payer: BC Managed Care – PPO | Admitting: Family Medicine

## 2020-03-22 ENCOUNTER — Encounter: Payer: Self-pay | Admitting: Family Medicine

## 2020-03-22 ENCOUNTER — Other Ambulatory Visit: Payer: Self-pay

## 2020-03-22 VITALS — BP 118/80 | HR 73 | Temp 97.9°F | Resp 16 | Ht 66.0 in | Wt 113.5 lb

## 2020-03-22 DIAGNOSIS — Z0000 Encounter for general adult medical examination without abnormal findings: Secondary | ICD-10-CM

## 2020-03-22 DIAGNOSIS — D693 Immune thrombocytopenic purpura: Secondary | ICD-10-CM | POA: Diagnosis not present

## 2020-03-22 DIAGNOSIS — R636 Underweight: Secondary | ICD-10-CM

## 2020-03-22 LAB — HEPATIC FUNCTION PANEL
ALT: 12 U/L (ref 0–35)
AST: 18 U/L (ref 0–37)
Albumin: 4.4 g/dL (ref 3.5–5.2)
Alkaline Phosphatase: 38 U/L — ABNORMAL LOW (ref 39–117)
Bilirubin, Direct: 0.1 mg/dL (ref 0.0–0.3)
Total Bilirubin: 0.7 mg/dL (ref 0.2–1.2)
Total Protein: 7 g/dL (ref 6.0–8.3)

## 2020-03-22 LAB — BASIC METABOLIC PANEL
BUN: 19 mg/dL (ref 6–23)
CO2: 28 mEq/L (ref 19–32)
Calcium: 9.2 mg/dL (ref 8.4–10.5)
Chloride: 103 mEq/L (ref 96–112)
Creatinine, Ser: 1.11 mg/dL (ref 0.40–1.20)
GFR: 54.59 mL/min — ABNORMAL LOW (ref >60.00)
Glucose, Bld: 81 mg/dL (ref 70–99)
Potassium: 4.2 mEq/L (ref 3.5–5.1)
Sodium: 138 mEq/L (ref 135–145)

## 2020-03-22 LAB — TSH: TSH: 1.71 u[IU]/mL (ref 0.35–4.50)

## 2020-03-22 LAB — CBC WITH DIFFERENTIAL/PLATELET
Basophils Absolute: 0 10*3/uL (ref 0.0–0.1)
Basophils Relative: 0.9 % (ref 0.0–3.0)
Eosinophils Absolute: 0.2 10*3/uL (ref 0.0–0.7)
Eosinophils Relative: 3.4 % (ref 0.0–5.0)
HCT: 44.1 % (ref 36.0–46.0)
Hemoglobin: 14.9 g/dL (ref 12.0–15.0)
Lymphocytes Relative: 24.4 % (ref 12.0–46.0)
Lymphs Abs: 1.3 10*3/uL (ref 0.7–4.0)
MCHC: 33.8 g/dL (ref 30.0–36.0)
MCV: 95.8 fl (ref 78.0–100.0)
Monocytes Absolute: 0.4 10*3/uL (ref 0.1–1.0)
Monocytes Relative: 7.7 % (ref 3.0–12.0)
Neutro Abs: 3.3 10*3/uL (ref 1.4–7.7)
Neutrophils Relative %: 63.6 % (ref 43.0–77.0)
Platelets: 94 10*3/uL — ABNORMAL LOW (ref 150.0–400.0)
RBC: 4.6 Mil/uL (ref 3.87–5.11)
RDW: 12.3 % (ref 11.5–15.5)
WBC: 5.2 10*3/uL (ref 4.0–10.5)

## 2020-03-22 LAB — LIPID PANEL
Cholesterol: 146 mg/dL (ref 0–200)
HDL: 58.3 mg/dL (ref >39.00)
LDL Cholesterol: 74 mg/dL (ref 0–99)
NonHDL: 87.51
Total CHOL/HDL Ratio: 3
Triglycerides: 66 mg/dL (ref 0.0–149.0)
VLDL: 13.2 mg/dL (ref 0.0–40.0)

## 2020-03-22 NOTE — Assessment & Plan Note (Signed)
Pt's PE WNL w/ exception of being underweight.  Pap scheduled.  UTD on immunizations.  Check labs.  Anticipatory guidance provided.

## 2020-03-22 NOTE — Patient Instructions (Signed)
Follow up in 1 year or as needed We'll notify you of your lab results and make any changes if needed Please have Dr Renaldo Fiddler send a copy of your pap results Call with any questions or concerns Have a great summer!!

## 2020-03-22 NOTE — Assessment & Plan Note (Signed)
Check labs to follow platelet count

## 2020-03-22 NOTE — Progress Notes (Signed)
I have discussed the procedure for the virtual visit with the patient who has given consent to proceed with assessment and treatment.   Lindsey Ramirez L Lindsey Ramirez, CMA     

## 2020-03-22 NOTE — Progress Notes (Signed)
° °  Subjective:    Patient ID: Lindsey Ramirez, female    DOB: Feb 21, 1981, 39 y.o.   MRN: [id number]  HPI CPE- has pap scheduled w/ GYN.  UTD on Tdap.  No concerns today.   Review of Systems Patient reports no vision/ hearing changes, adenopathy,fever, weight change,  persistant/recurrent hoarseness , swallowing issues, chest pain, palpitations, edema, persistant/recurrent cough, hemoptysis, dyspnea (rest/exertional/paroxysmal nocturnal), gastrointestinal bleeding (melena, rectal bleeding), abdominal pain, significant heartburn, bowel changes, GU symptoms (dysuria, hematuria, incontinence), Gyn symptoms (abnormal  bleeding, pain),  syncope, focal weakness, memory loss, numbness & tingling, skin/hair/nail changes, abnormal bruising or bleeding, anxiety, or depression.   This visit occurred during the SARS-CoV-2 public health emergency.  Safety protocols were in place, including screening questions prior to the visit, additional usage of staff PPE, and extensive cleaning of exam room while observing appropriate contact time as indicated for disinfecting solutions.       Objective:   Physical Exam General Appearance:    Alert, cooperative, no distress, appears stated age  Head:    Normocephalic, without obvious abnormality, atraumatic  Eyes:    PERRL, conjunctiva/corneas clear, EOM's intact, fundi    benign, both eyes  Ears:    Normal TM's and external ear canals, both ears  Nose:   Deferred due to COVID  Throat:   Neck:   Supple, symmetrical, trachea midline, no adenopathy;    Thyroid: no enlargement/tenderness/nodules  Back:     Symmetric, no curvature, ROM normal, no CVA tenderness  Lungs:     Clear to auscultation bilaterally, respirations unlabored  Chest Wall:    No tenderness or deformity   Heart:    Regular rate and rhythm, S1 and S2 normal, no murmur, rub   or gallop  Breast Exam:    Deferred to GYN  Abdomen:     Soft, non-tender, bowel sounds active all four quadrants,    no  masses, no organomegaly  Genitalia:    Deferred to GYN  Rectal:    Extremities:   Extremities normal, atraumatic, no cyanosis or edema  Pulses:   2+ and symmetric all extremities  Skin:   Skin color, texture, turgor normal, no rashes or lesions  Lymph nodes:   Cervical, supraclavicular, and axillary nodes normal  Neurologic:   CNII-XII intact, normal strength, sensation and reflexes    throughout          Assessment & Plan:

## 2020-04-26 DIAGNOSIS — R87619 Unspecified abnormal cytological findings in specimens from cervix uteri: Secondary | ICD-10-CM | POA: Insufficient documentation

## 2021-02-08 ENCOUNTER — Telehealth: Payer: Self-pay

## 2021-02-08 ENCOUNTER — Other Ambulatory Visit: Payer: Self-pay

## 2021-02-08 MED ORDER — VALACYCLOVIR HCL 1 G PO TABS: 2000.0000 mg | ORAL_TABLET | Freq: Two times a day (BID) | ORAL | 0 refills | Status: DC

## 2021-02-08 NOTE — Telephone Encounter (Signed)
Patient states she has a cold sore that has popped up on her nose.    States that Dr. Beverely Low has prescribed her valtrex to keep on hand for a flair up.    States she is currently out of this medication and would like to see if a script can be sent to her pharmacy.    [facility] uses [facility] in [place].

## 2021-02-08 NOTE — Telephone Encounter (Signed)
LFD 07/21/19 #21 with no refills LOV 03/22/20 NOV 03/26/21

## 2021-02-08 NOTE — Telephone Encounter (Signed)
Sent to pcp for approval 

## 2021-03-20 ENCOUNTER — Encounter: Payer: Self-pay | Admitting: *Deleted

## 2021-03-26 ENCOUNTER — Ambulatory Visit (INDEPENDENT_AMBULATORY_CARE_PROVIDER_SITE_OTHER): Payer: BC Managed Care – PPO | Admitting: Family Medicine

## 2021-03-26 ENCOUNTER — Encounter: Payer: Self-pay | Admitting: Family Medicine

## 2021-03-26 ENCOUNTER — Other Ambulatory Visit: Payer: Self-pay

## 2021-03-26 VITALS — BP 101/80 | HR 69 | Temp 98.0°F | Resp 20 | Ht 66.0 in | Wt 111.4 lb

## 2021-03-26 DIAGNOSIS — R636 Underweight: Secondary | ICD-10-CM | POA: Diagnosis not present

## 2021-03-26 DIAGNOSIS — D693 Immune thrombocytopenic purpura: Secondary | ICD-10-CM | POA: Diagnosis not present

## 2021-03-26 DIAGNOSIS — Z0000 Encounter for general adult medical examination without abnormal findings: Secondary | ICD-10-CM | POA: Diagnosis not present

## 2021-03-26 LAB — CBC WITH DIFFERENTIAL/PLATELET
Basophils Absolute: 0 10*3/uL (ref 0.0–0.1)
Basophils Relative: 1 % (ref 0.0–3.0)
Eosinophils Absolute: 0.3 10*3/uL (ref 0.0–0.7)
Eosinophils Relative: 6.4 % — ABNORMAL HIGH (ref 0.0–5.0)
HCT: 42.6 % (ref 36.0–46.0)
Hemoglobin: 14.4 g/dL (ref 12.0–15.0)
Lymphocytes Relative: 31.8 % (ref 12.0–46.0)
Lymphs Abs: 1.2 10*3/uL (ref 0.7–4.0)
MCHC: 33.9 g/dL (ref 30.0–36.0)
MCV: 94.8 fl (ref 78.0–100.0)
Monocytes Absolute: 0.4 10*3/uL (ref 0.1–1.0)
Monocytes Relative: 10.9 % (ref 3.0–12.0)
Neutro Abs: 1.9 10*3/uL (ref 1.4–7.7)
Neutrophils Relative %: 49.9 % (ref 43.0–77.0)
Platelets: 83 10*3/uL — ABNORMAL LOW (ref 150.0–400.0)
RBC: 4.5 Mil/uL (ref 3.87–5.11)
RDW: 13.2 % (ref 11.5–15.5)
WBC: 3.9 10*3/uL — ABNORMAL LOW (ref 4.0–10.5)

## 2021-03-26 LAB — HEPATIC FUNCTION PANEL
ALT: 19 U/L (ref 0–35)
AST: 21 U/L (ref 0–37)
Albumin: 4.4 g/dL (ref 3.5–5.2)
Alkaline Phosphatase: 56 U/L (ref 39–117)
Bilirubin, Direct: 0.1 mg/dL (ref 0.0–0.3)
Total Bilirubin: 0.7 mg/dL (ref 0.2–1.2)
Total Protein: 6.8 g/dL (ref 6.0–8.3)

## 2021-03-26 LAB — BASIC METABOLIC PANEL
BUN: 22 mg/dL (ref 6–23)
CO2: 27 mEq/L (ref 19–32)
Calcium: 9.2 mg/dL (ref 8.4–10.5)
Chloride: 105 mEq/L (ref 96–112)
Creatinine, Ser: 1.02 mg/dL (ref 0.40–1.20)
GFR: 68.83 mL/min (ref >60.00)
Glucose, Bld: 88 mg/dL (ref 70–99)
Potassium: 4.2 mEq/L (ref 3.5–5.1)
Sodium: 139 mEq/L (ref 135–145)

## 2021-03-26 LAB — TSH: TSH: 2.14 u[IU]/mL (ref 0.35–5.50)

## 2021-03-26 LAB — LIPID PANEL
Cholesterol: 159 mg/dL (ref 0–200)
HDL: 66.2 mg/dL (ref >39.00)
LDL Cholesterol: 80 mg/dL (ref 0–99)
NonHDL: 92.75
Total CHOL/HDL Ratio: 2
Triglycerides: 64 mg/dL (ref 0.0–149.0)
VLDL: 12.8 mg/dL (ref 0.0–40.0)

## 2021-03-26 LAB — VITAMIN D 25 HYDROXY (VIT D DEFICIENCY, FRACTURES): VITD: 91.69 ng/mL (ref 30.00–100.00)

## 2021-03-26 NOTE — Assessment & Plan Note (Addendum)
Following w/ Dr Bertis Ruddy as needed

## 2021-03-26 NOTE — Assessment & Plan Note (Signed)
Pt's PE WNL w/ exception of being underweight.  She has pap and mammo scheduled.  UTD on Tdap.  Declines COVID.  Discussed need for regular meals/snacks to maintain weight.  Check labs.  Anticipatory guidance provided.

## 2021-03-26 NOTE — Patient Instructions (Signed)
Follow up in 1 year or as needed We'll notify you of your lab results and make any changes if needed Continue to eat often to maintain your weight Call with any questions or concerns Have a great trip!!!

## 2021-03-26 NOTE — Progress Notes (Signed)
   Subjective:    Patient ID: Lindsey Ramirez, female    DOB: 03/10/1981, 40 y.o.   MRN: [id number]  HPI CPE-   UTD on Tdap, declines COVID.  Pap and mammo scheduled.  No concerns today  Reviewed past medical, surgical, family and social histories.   Patient Care Team    Relationship Specialty Notifications Start End  Sheliah Hatch, MD PCP - General Family Medicine  07/25/16   Zelphia Cairo, MD Attending Physician Obstetrics and Gynecology  01/12/13   Artis Delay, MD Consulting Physician Hematology and Oncology  08/03/13     Health Maintenance  Topic Date Due   PAP SMEAR-Modifier  12/19/2018   Hepatitis C Screening  03/26/2022 (Originally 10/03/1998)   INFLUENZA VACCINE  04/22/2021   TETANUS/TDAP  07/26/2023   HIV Screening  Completed   Pneumococcal Vaccine 29-22 Years old  Aged Out   HPV VACCINES  Aged Out      Review of Systems Patient reports no vision/ hearing changes, adenopathy,fever, weight change,  persistant/recurrent hoarseness , swallowing issues, chest pain, palpitations, edema, persistant/recurrent cough, hemoptysis, dyspnea (rest/exertional/paroxysmal nocturnal), gastrointestinal bleeding (melena, rectal bleeding), abdominal pain, significant heartburn, bowel changes, GU symptoms (dysuria, hematuria, incontinence), Gyn symptoms (abnormal  bleeding, pain),  syncope, focal weakness, memory loss, numbness & tingling, skin/hair/nail changes, abnormal bruising or bleeding, anxiety, or depression.   This visit occurred during the SARS-CoV-2 public health emergency.  Safety protocols were in place, including screening questions prior to the visit, additional usage of staff PPE, and extensive cleaning of exam room while observing appropriate contact time as indicated for disinfecting solutions.      Objective:   Physical Exam General Appearance:    Alert, cooperative, no distress, appears stated age  Head:    Normocephalic, without obvious abnormality, atraumatic  Eyes:     PERRL, conjunctiva/corneas clear, EOM's intact, fundi    benign, both eyes  Ears:    Normal TM's and external ear canals, both ears  Nose:   Deferred due to COVID  Throat:   Neck:   Supple, symmetrical, trachea midline, no adenopathy;    Thyroid: no enlargement/tenderness/nodules  Back:     Symmetric, no curvature, ROM normal, no CVA tenderness  Lungs:     Clear to auscultation bilaterally, respirations unlabored  Chest Wall:    No tenderness or deformity   Heart:    Regular rate and rhythm, S1 and S2 normal, no murmur, rub   or gallop  Breast Exam:    Deferred to GYN  Abdomen:     Soft, non-tender, bowel sounds active all four quadrants,    no masses, no organomegaly  Genitalia:    Deferred to GYN  Rectal:    Extremities:   Extremities normal, atraumatic, no cyanosis or edema  Pulses:   2+ and symmetric all extremities  Skin:   Skin color, texture, turgor normal, no rashes or lesions  Lymph nodes:   Cervical, supraclavicular, and axillary nodes normal  Neurologic:   CNII-XII intact, normal strength, sensation and reflexes    throughout          Assessment & Plan:

## 2021-04-30 ENCOUNTER — Other Ambulatory Visit: Payer: Self-pay | Admitting: Family Medicine

## 2021-04-30 NOTE — Telephone Encounter (Signed)
LFD 02/08/21 #21 with no refills LOV 03/26/21 NOV none

## 2021-05-06 ENCOUNTER — Other Ambulatory Visit: Payer: Self-pay | Admitting: Obstetrics and Gynecology

## 2021-05-06 DIAGNOSIS — R928 Other abnormal and inconclusive findings on diagnostic imaging of breast: Secondary | ICD-10-CM

## 2021-05-09 ENCOUNTER — Ambulatory Visit
Admission: RE | Admit: 2021-05-09 | Discharge: 2021-05-09 | Disposition: A | Discharge status: Home / Self Care | Payer: BC Managed Care – PPO | Source: Ambulatory Visit | Attending: Obstetrics and Gynecology | Admitting: Obstetrics and Gynecology

## 2021-05-09 ENCOUNTER — Other Ambulatory Visit: Payer: Self-pay

## 2021-05-09 DIAGNOSIS — R928 Other abnormal and inconclusive findings on diagnostic imaging of breast: Secondary | ICD-10-CM

## 2022-02-01 IMAGING — US US BREAST*R* LIMITED INC AXILLA
1 series · 2 of 2 positions shown · non-contrast
Comparison: Previous exam(s).

CLINICAL DATA: Screening recall for a possible right breast mass.

EXAM:
DIGITAL DIAGNOSTIC UNILATERAL RIGHT MAMMOGRAM WITH TOMOSYNTHESIS AND
CAD; ULTRASOUND RIGHT BREAST LIMITED
TECHNIQUE: Right digital diagnostic mammography and breast tomosynthesis was
performed. The images were evaluated with computer-aided detection.;
Targeted ultrasound examination of the right breast was performed

[Series 1: us breast*right* limited inc axilla · 0.06mm/px · 2 of 2 slices shown]
[im 1/2]
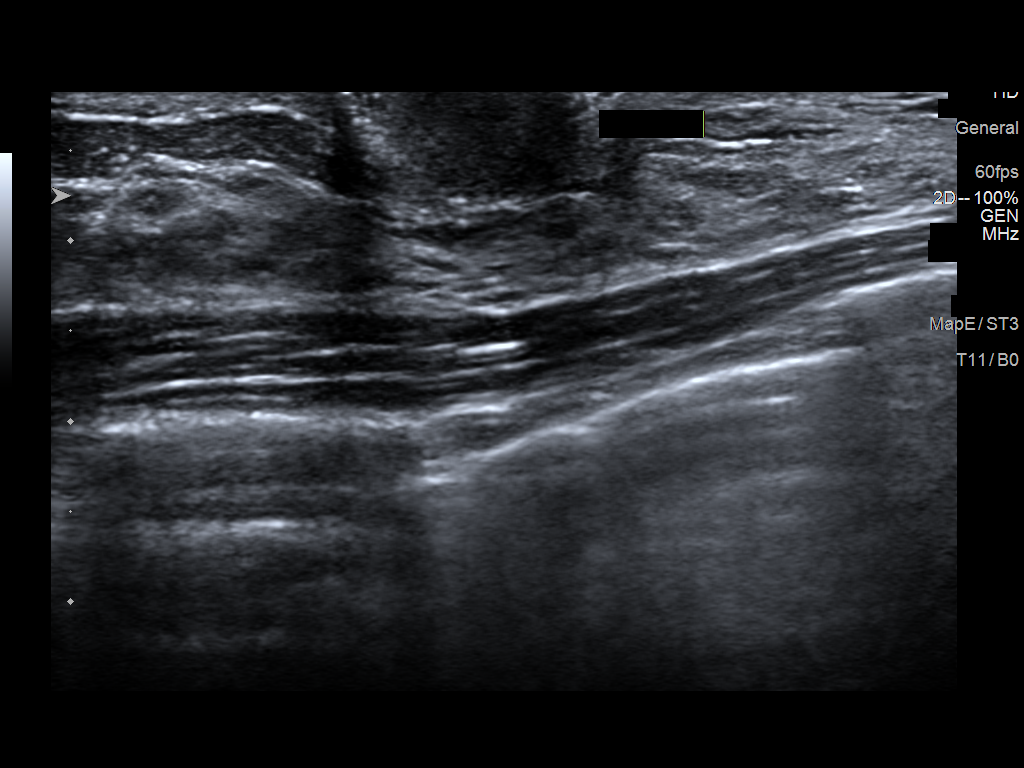
[im 2/2]
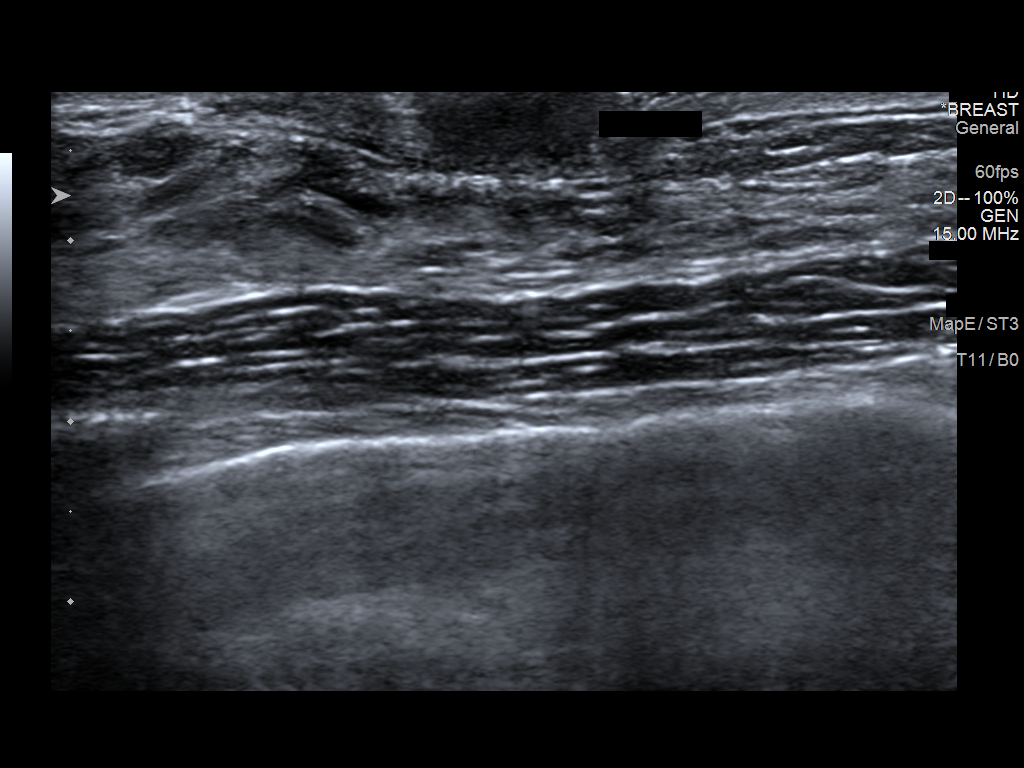

[2 of 2 positions shown; findings below may reference images not displayed]

ACR Breast Density Category d: The breast tissue is extremely dense,
which lowers the sensitivity of mammography.
FINDINGS: Spot compression tomosynthesis images through the retroareolar right
breast demonstrates normal but very dense fibroglandular tissue. No
suspicious masses or areas of shadowing are identified.

Ultrasound targeted to the retroareolar right breast demonstrates
normal fibroglandular tissue. No suspicious masses or areas of
shadowing are identified.
IMPRESSION: 1. There are no persistent suspicious mammographic or targeted
sonographic abnormalities in the retroareolar right breast.

RECOMMENDATION:
Screening mammogram in one year.(Code:41-R-GHW)

I have discussed the findings and recommendations with the patient.
If applicable, a reminder letter will be sent to the patient
regarding the next appointment.

BI-RADS CATEGORY  1: Negative.

## 2022-02-01 IMAGING — MG MM DIGITAL DIAGNOSTIC UNILAT*R* W/ TOMO W/ CAD
4 series · 4 of 12 positions shown · non-contrast
Comparison: Previous exam(s).

CLINICAL DATA: Screening recall for a possible right breast mass.

EXAM:
DIGITAL DIAGNOSTIC UNILATERAL RIGHT MAMMOGRAM WITH TOMOSYNTHESIS AND
CAD; ULTRASOUND RIGHT BREAST LIMITED
TECHNIQUE: Right digital diagnostic mammography and breast tomosynthesis was
performed. The images were evaluated with computer-aided detection.;
Targeted ultrasound examination of the right breast was performed

[R MLO synth-2D]
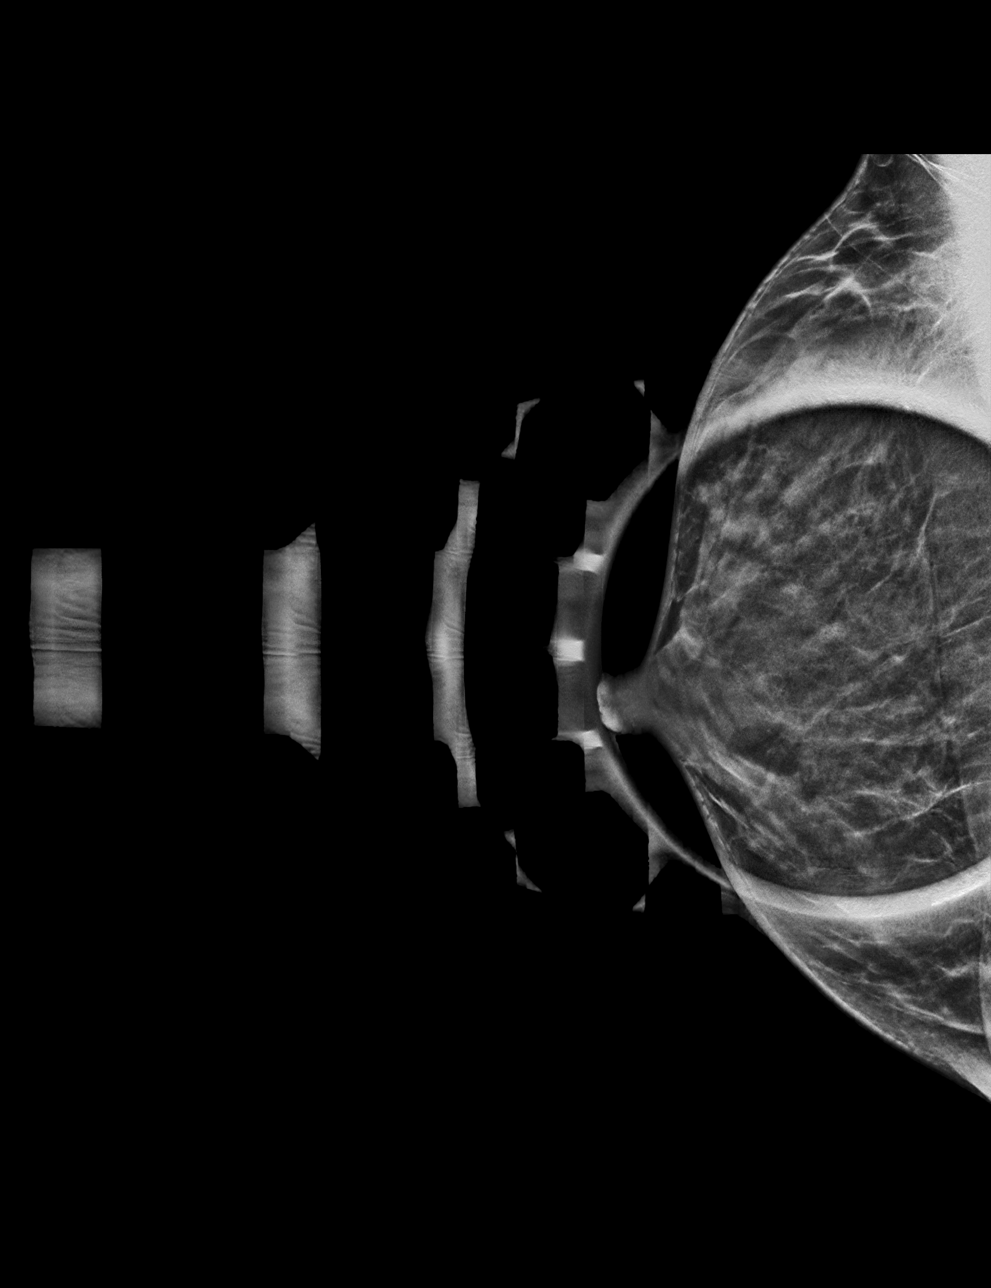

[R CC synth-2D]
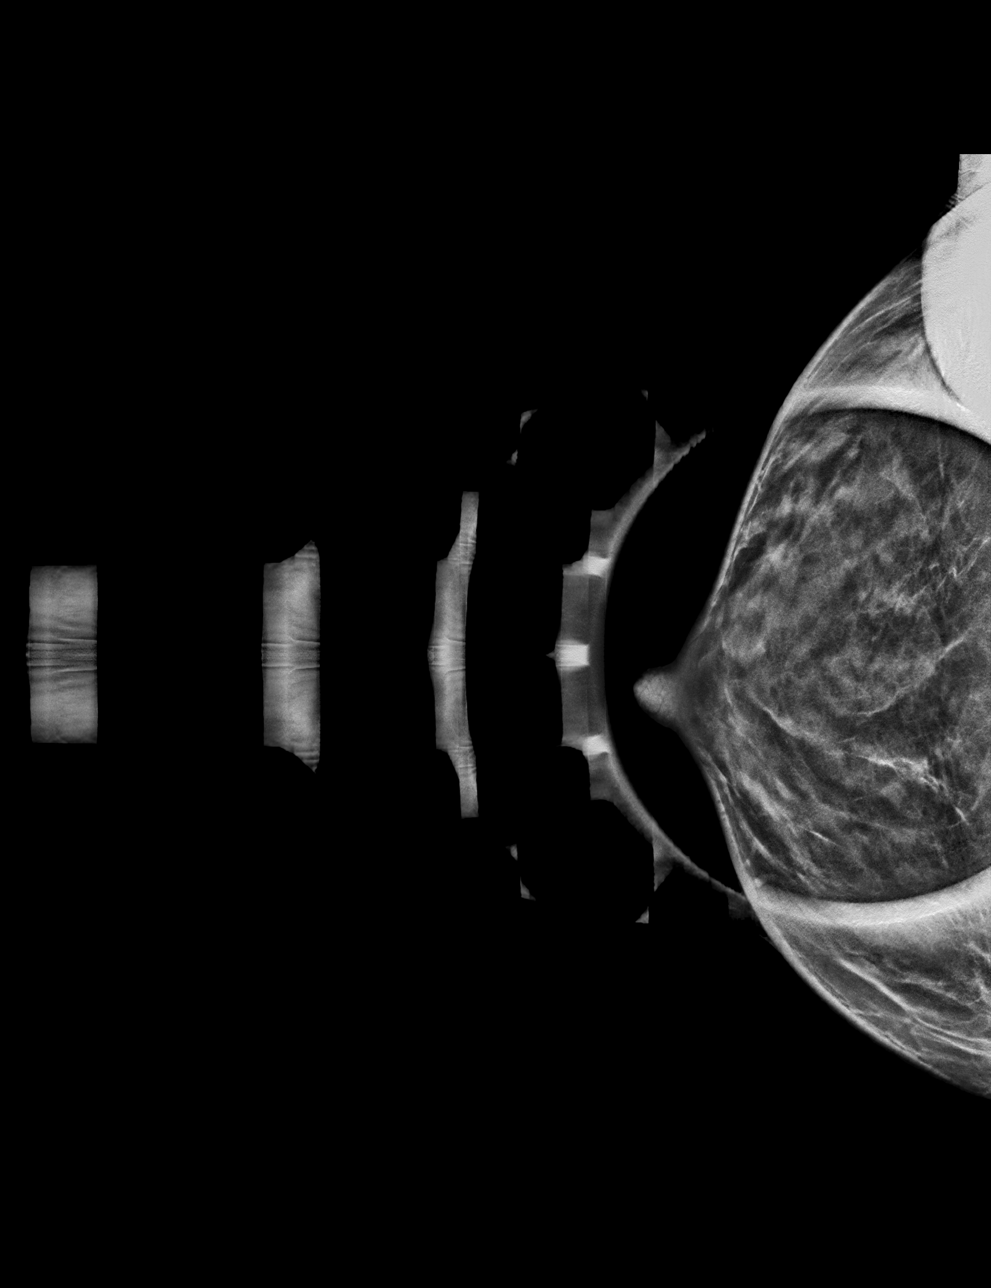

[R MLO tomo · tomo slice 21/42.0]
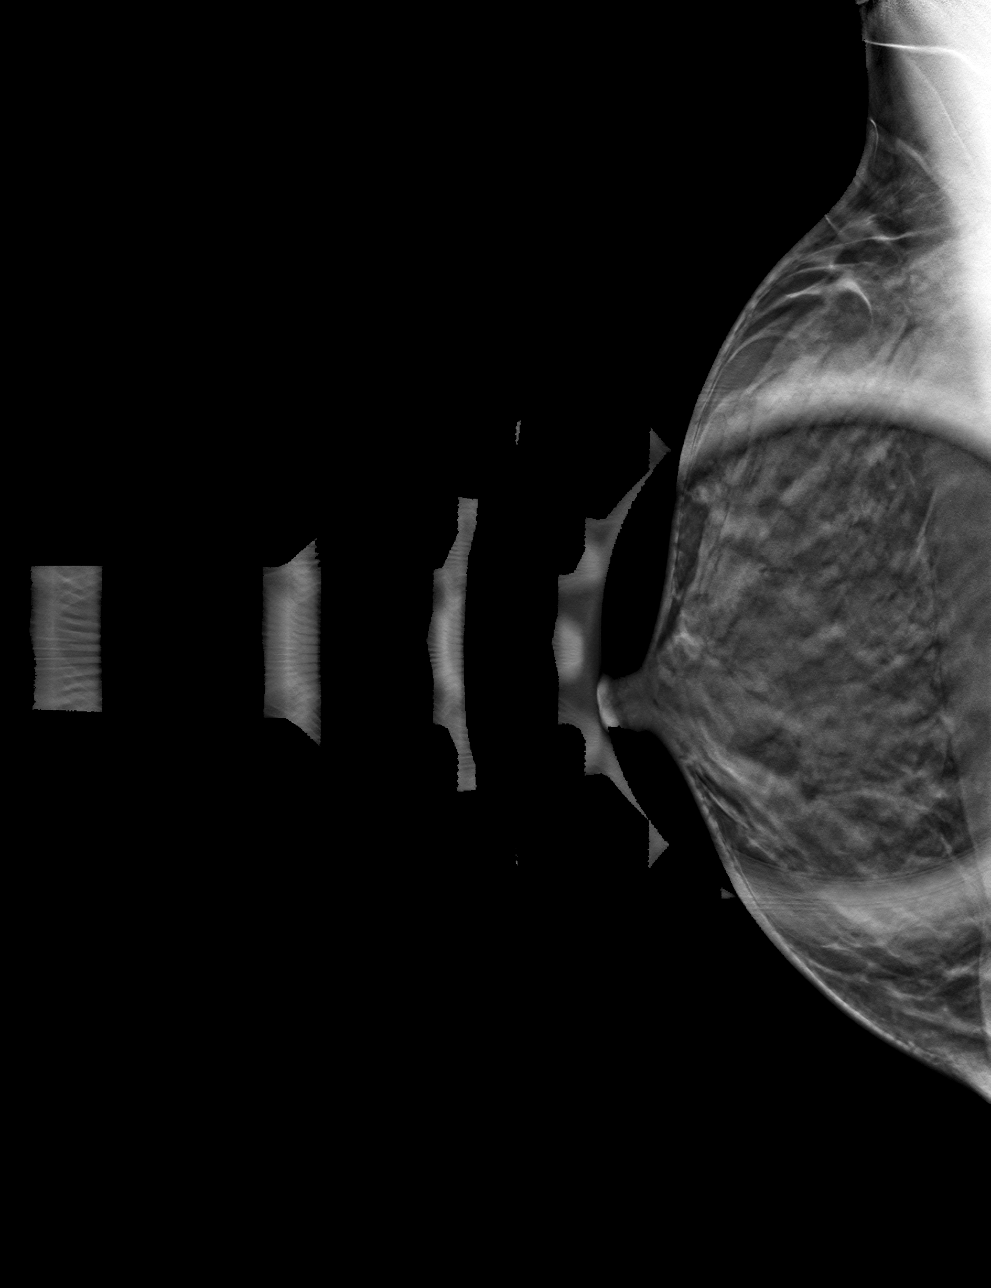

[R CC tomo · tomo slice 24/47.0]
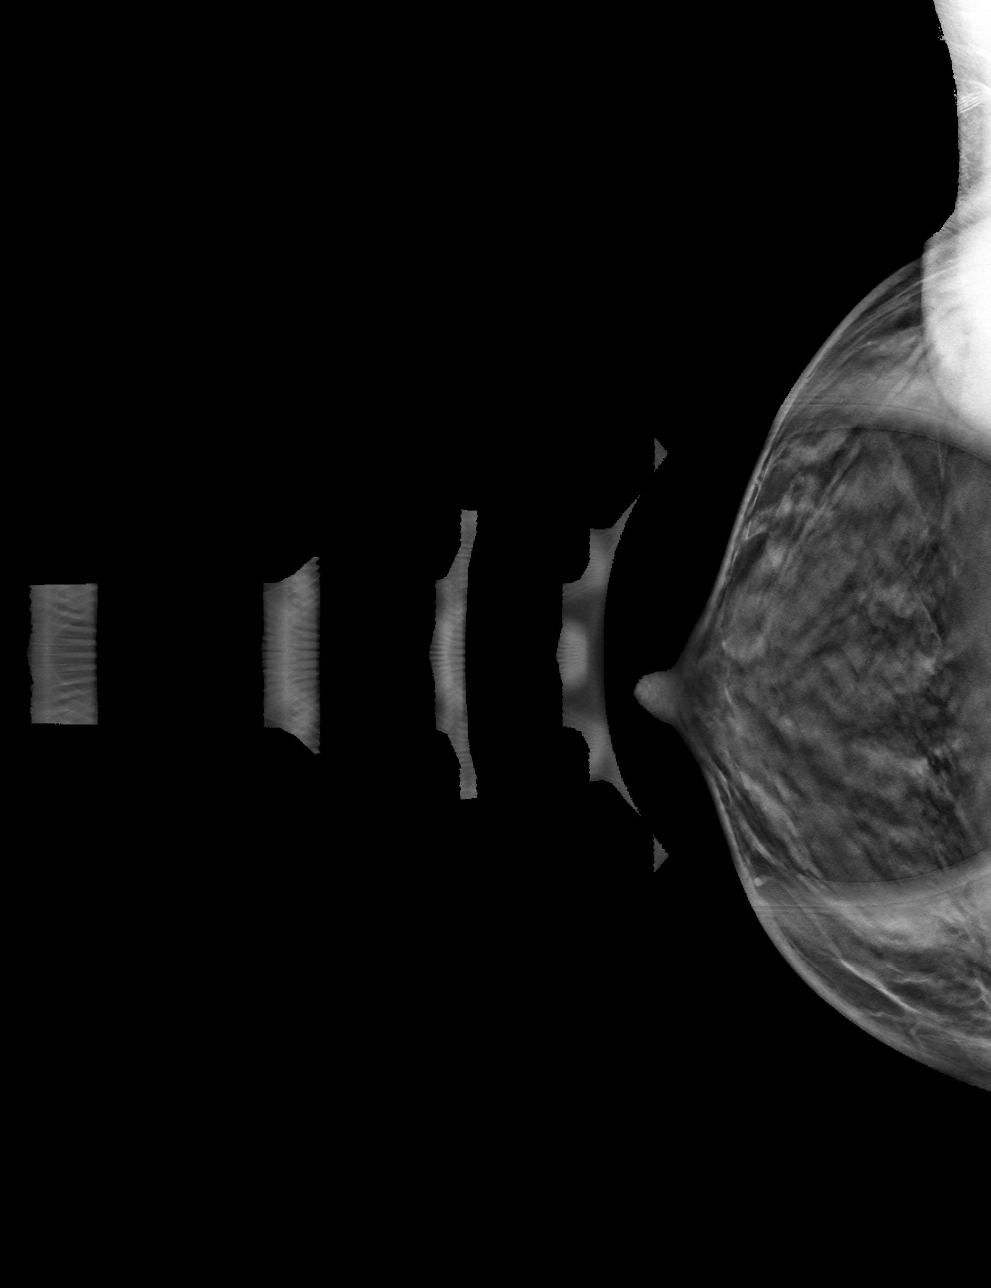

[4 of 12 positions shown; findings below may reference images not displayed]

ACR Breast Density Category d: The breast tissue is extremely dense,
which lowers the sensitivity of mammography.
FINDINGS: Spot compression tomosynthesis images through the retroareolar right
breast demonstrates normal but very dense fibroglandular tissue. No
suspicious masses or areas of shadowing are identified.

Ultrasound targeted to the retroareolar right breast demonstrates
normal fibroglandular tissue. No suspicious masses or areas of
shadowing are identified.
IMPRESSION: 1. There are no persistent suspicious mammographic or targeted
sonographic abnormalities in the retroareolar right breast.

RECOMMENDATION:
Screening mammogram in one year.(Code:41-R-GHW)

I have discussed the findings and recommendations with the patient.
If applicable, a reminder letter will be sent to the patient
regarding the next appointment.

BI-RADS CATEGORY  1: Negative.

## 2022-12-24 ENCOUNTER — Ambulatory Visit (INDEPENDENT_AMBULATORY_CARE_PROVIDER_SITE_OTHER): Payer: BC Managed Care – PPO | Admitting: Family Medicine

## 2022-12-24 ENCOUNTER — Encounter: Payer: Self-pay | Admitting: Family Medicine

## 2022-12-24 VITALS — BP 108/80 | HR 75 | Temp 98.0°F | Resp 17 | Ht 66.0 in | Wt 110.4 lb

## 2022-12-24 DIAGNOSIS — Z0000 Encounter for general adult medical examination without abnormal findings: Secondary | ICD-10-CM | POA: Diagnosis not present

## 2022-12-24 DIAGNOSIS — R636 Underweight: Secondary | ICD-10-CM

## 2022-12-24 LAB — CBC WITH DIFFERENTIAL/PLATELET
Basophils Absolute: 0.1 10*3/uL (ref 0.0–0.1)
Basophils Relative: 1.4 % (ref 0.0–3.0)
Eosinophils Absolute: 0.3 10*3/uL (ref 0.0–0.7)
Eosinophils Relative: 6.8 % — ABNORMAL HIGH (ref 0.0–5.0)
HCT: 44.6 % (ref 36.0–46.0)
Hemoglobin: 15.2 g/dL — ABNORMAL HIGH (ref 12.0–15.0)
Lymphocytes Relative: 27.7 % (ref 12.0–46.0)
Lymphs Abs: 1.3 10*3/uL (ref 0.7–4.0)
MCHC: 34.1 g/dL (ref 30.0–36.0)
MCV: 94.8 fl (ref 78.0–100.0)
Monocytes Absolute: 0.4 10*3/uL (ref 0.1–1.0)
Monocytes Relative: 9.6 % (ref 3.0–12.0)
Neutro Abs: 2.5 10*3/uL (ref 1.4–7.7)
Neutrophils Relative %: 54.5 % (ref 43.0–77.0)
Platelets: 106 10*3/uL — ABNORMAL LOW (ref 150.0–400.0)
RBC: 4.71 Mil/uL (ref 3.87–5.11)
RDW: 12.9 % (ref 11.5–15.5)
WBC: 4.6 10*3/uL (ref 4.0–10.5)

## 2022-12-24 LAB — HEPATIC FUNCTION PANEL
ALT: 14 U/L (ref 0–35)
AST: 19 U/L (ref 0–37)
Albumin: 4.6 g/dL (ref 3.5–5.2)
Alkaline Phosphatase: 53 U/L (ref 39–117)
Bilirubin, Direct: 0.1 mg/dL (ref 0.0–0.3)
Total Bilirubin: 0.6 mg/dL (ref 0.2–1.2)
Total Protein: 7.3 g/dL (ref 6.0–8.3)

## 2022-12-24 LAB — LIPID PANEL
Cholesterol: 163 mg/dL (ref 0–200)
HDL: 65.3 mg/dL (ref >39.00)
LDL Cholesterol: 88 mg/dL (ref 0–99)
NonHDL: 97.27
Total CHOL/HDL Ratio: 2
Triglycerides: 48 mg/dL (ref 0.0–149.0)
VLDL: 9.6 mg/dL (ref 0.0–40.0)

## 2022-12-24 LAB — BASIC METABOLIC PANEL
BUN: 21 mg/dL (ref 6–23)
CO2: 29 mEq/L (ref 19–32)
Calcium: 9.4 mg/dL (ref 8.4–10.5)
Chloride: 104 mEq/L (ref 96–112)
Creatinine, Ser: 1.03 mg/dL (ref 0.40–1.20)
GFR: 67.2 mL/min (ref >60.00)
Glucose, Bld: 90 mg/dL (ref 70–99)
Potassium: 3.9 mEq/L (ref 3.5–5.1)
Sodium: 137 mEq/L (ref 135–145)

## 2022-12-24 LAB — VITAMIN D 25 HYDROXY (VIT D DEFICIENCY, FRACTURES): VITD: 76.77 ng/mL (ref 30.00–100.00)

## 2022-12-24 LAB — TSH: TSH: 2.29 u[IU]/mL (ref 0.35–5.50)

## 2022-12-24 NOTE — Progress Notes (Signed)
   Subjective:    Patient ID: Lindsey Ramirez, female    DOB: 01-12-1981, 42 y.o.   MRN: [id number]  HPI CPE- UTD on pap, mammo (will get records), UTD on Tdap.  No concerns today  Patient Care Team    Relationship Specialty Notifications Start End  Midge Minium, MD PCP - General Family Medicine  07/25/16   Marylynn Pearson, MD Attending Physician Obstetrics and Gynecology  01/12/13   Heath Lark, MD Consulting Physician Hematology and Oncology  08/03/13      Health Maintenance  Topic Date Due   PAP SMEAR-Modifier  12/19/2018   INFLUENZA VACCINE  04/23/2023   DTaP/Tdap/Td (2 - Td or Tdap) 07/26/2023   HIV Screening  Completed   HPV VACCINES  Aged Out   COVID-19 Vaccine  Discontinued   Hepatitis C Screening  Discontinued      Review of Systems Patient reports no vision/ hearing changes, adenopathy,fever, weight change,  persistant/recurrent hoarseness , swallowing issues, chest pain, palpitations, edema, persistant/recurrent cough, hemoptysis, dyspnea (rest/exertional/paroxysmal nocturnal), gastrointestinal bleeding (melena, rectal bleeding), abdominal pain, significant heartburn, bowel changes, GU symptoms (dysuria, hematuria, incontinence), Gyn symptoms (abnormal  bleeding, pain),  syncope, focal weakness, memory loss, skin/hair/nail changes, abnormal bruising or bleeding, anxiety, or depression.   + L hand numbness- positional, worse w/ working out    Objective:   Physical Exam General Appearance:    Alert, cooperative, no distress, appears stated age  Head:    Normocephalic, without obvious abnormality, atraumatic  Eyes:    PERRL, conjunctiva/corneas clear, EOM's intact both eyes  Ears:    Normal TM's and external ear canals, both ears  Nose:   Nares normal, septum midline, mucosa normal, no drainage    or sinus tenderness  Throat:   Lips, mucosa, and tongue normal; teeth and gums normal  Neck:   Supple, symmetrical, trachea midline, no adenopathy;    Thyroid: no  enlargement/tenderness/nodules  Back:     Symmetric, no curvature, ROM normal, no CVA tenderness  Lungs:     Clear to auscultation bilaterally, respirations unlabored  Chest Wall:    No tenderness or deformity   Heart:    Regular rate and rhythm, S1 and S2 normal, no murmur, rub   or gallop  Breast Exam:    Deferred to GYN  Abdomen:     Soft, non-tender, bowel sounds active all four quadrants,    no masses, no organomegaly  Genitalia:    Deferred to GYN  Rectal:    Extremities:   Extremities normal, atraumatic, no cyanosis or edema  Pulses:   2+ and symmetric all extremities  Skin:   Skin color, texture, turgor normal, no rashes or lesions  Lymph nodes:   Cervical, supraclavicular, and axillary nodes normal  Neurologic:   CNII-XII intact, normal strength, sensation and reflexes    throughout          Assessment & Plan:

## 2022-12-24 NOTE — Patient Instructions (Signed)
Follow up in 1 year or as needed ?We'll notify you of your lab results and make any changes if needed ?Keep up the good work!  You look great! ?Call with any questions or concerns ?Stay Safe!  Stay Healthy!! ?Happy Spring!! ?

## 2022-12-24 NOTE — Assessment & Plan Note (Signed)
Pt's PE WNL w/ exception of low BMI.  UTD on pap, mammo, Tdap.  Check labs.  Anticipatory guidance provided.

## 2022-12-25 ENCOUNTER — Telehealth: Payer: Self-pay

## 2022-12-25 NOTE — Telephone Encounter (Signed)
-----   Message from Midge Minium, MD sent at 12/25/2022  7:36 AM EDT ----- Labs look great!  No changes at this time

## 2022-12-25 NOTE — Telephone Encounter (Signed)
Pt seen results Via my chart  

## 2023-07-27 ENCOUNTER — Encounter: Payer: Self-pay | Admitting: Family Medicine

## 2023-07-27 ENCOUNTER — Telehealth: Payer: BC Managed Care – PPO | Admitting: Family Medicine

## 2023-07-27 VITALS — Wt 112.0 lb

## 2023-07-27 DIAGNOSIS — H1033 Unspecified acute conjunctivitis, bilateral: Secondary | ICD-10-CM | POA: Diagnosis not present

## 2023-07-27 MED ORDER — POLYMYXIN B-TRIMETHOPRIM 10000-0.1 UNIT/ML-% OP SOLN: OPHTHALMIC | 0 refills | Status: AC

## 2023-07-27 NOTE — Progress Notes (Signed)
Virtual Visit via Video Note  I connected with Lindsey Ramirez  on 07/27/23 at 11:20 AM EST by a video enabled telemedicine application and verified that I am speaking with the correct person using two identifiers.  Location patient: [place] Location provider:work or home office Persons participating in the virtual visit: patient, provider  I discussed the limitations and requested verbal permission for telemedicine visit. The patient expressed understanding and agreed to proceed.   HPI: 42 year old female being seen today for recent onset eye irritation and discharge. She noted her eyes having crusty exudate about 12 hours ago.  They feel somewhat irritated.  Right eye feels a bit worse than the left.  No vision abnormalities. She does wear contacts but is now wearing her glasses. No recent URI or fever. She has been at a softball tournament with her daughter for the last couple of days and has been around a lot of 42 year old girls.  ROS: See pertinent positives and negatives per HPI.  Past Medical History:  Diagnosis Date   Thrombocytopenia, unspecified (HCC) 06/13/2013    Past Surgical History:  Procedure Laterality Date   TONSILLECTOMY AND ADENOIDECTOMY       Current Outpatient Medications:    Cholecalciferol (VITAMIN D PO), Take by mouth., Disp: , Rfl:    Multiple Vitamins-Minerals (MULTIVITAMIN WOMEN PO), Take by mouth., Disp: , Rfl:    valACYclovir (VALTREX) 1000 MG tablet, TAKE 2 TABLETS BY MOUTH EVERY 12 HOURS FOR 2 DOSES AT ONSET OF EACH OUTBREAK., Disp: 21 tablet, Rfl: 0  EXAM:  VITALS per patient if applicable:      07/27/2023   11:22 AM 12/24/2022    9:19 AM 03/26/2021    9:03 AM  Vitals with BMI  Height  5\' 6"  5\' 6"   Weight 112 lbs 110 lbs 6 oz 111 lbs 6 oz  BMI  17.82 17.99  Systolic  108 101  Diastolic  80 80  Pulse  75 69     GENERAL: alert, oriented, appears well and in no acute distress  HEENT: atraumatic, no obvious abnormalities on inspection of eyes,  external nose, and ears  NECK: normal movements of the head and neck  LUNGS: on inspection no signs of respiratory distress, breathing rate appears normal, no obvious gross SOB, gasping or wheezing  CV: no obvious cyanosis  MS: moves all visible extremities without noticeable abnormality  PSYCH/NEURO: pleasant and cooperative, no obvious depression or anxiety, speech and thought processing grossly intact  LABS: none today  ASSESSMENT AND PLAN:  Discussed the following assessment and plan:  Acute conjunctivitis, bilateral. Polytrim drops, 2 drops 3 times a day for 5 days.  I discussed the assessment and treatment plan with the patient. The patient was provided an opportunity to ask questions and all were answered. The patient agreed with the plan and demonstrated an understanding of the instructions.   F/u: if not improving in a few days  Signed:  Santiago Bumpers, MD           07/27/2023
